# Patient Record
Sex: Female | Born: 1990 | Race: Black or African American | Hispanic: No | Marital: Single | Smoking: Former smoker
Health system: Southern US, Community
[De-identification: ages and names within clinical notes are randomized; demographics above are authoritative.]

## PROBLEM LIST (undated history)

## (undated) ENCOUNTER — Inpatient Hospital Stay (HOSPITAL_COMMUNITY): Payer: Self-pay

## (undated) DIAGNOSIS — R87619 Unspecified abnormal cytological findings in specimens from cervix uteri: Secondary | ICD-10-CM

## (undated) DIAGNOSIS — IMO0002 Reserved for concepts with insufficient information to code with codable children: Secondary | ICD-10-CM

## (undated) DIAGNOSIS — Z8619 Personal history of other infectious and parasitic diseases: Secondary | ICD-10-CM

## (undated) HISTORY — PX: WISDOM TOOTH EXTRACTION: SHX21

## (undated) HISTORY — DX: Personal history of other infectious and parasitic diseases: Z86.19

---
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
## 2009-01-26 ENCOUNTER — Emergency Department (HOSPITAL_COMMUNITY): Admission: EM | Admit: 2009-01-26 | Discharge: 2009-01-26 | Payer: Self-pay | Admitting: Family Medicine

## 2009-05-21 ENCOUNTER — Emergency Department (HOSPITAL_COMMUNITY): Admission: EM | Admit: 2009-05-21 | Discharge: 2009-05-21 | Payer: Self-pay | Admitting: Emergency Medicine

## 2009-10-29 ENCOUNTER — Emergency Department (HOSPITAL_COMMUNITY): Admission: EM | Admit: 2009-10-29 | Discharge: 2009-10-29 | Payer: Self-pay | Admitting: Emergency Medicine

## 2009-10-29 ENCOUNTER — Emergency Department (HOSPITAL_COMMUNITY)
Admission: EM | Admit: 2009-10-29 | Discharge: 2009-10-29 | Payer: Self-pay | Source: Home / Self Care | Admitting: Family Medicine

## 2010-01-30 NOTE — L&D Delivery Note (Signed)
Delivery Note At 8:58 PM a viable female was delivered via Vaginal, Vacuum (Extractor) (Presentation: ; Occiput Anterior).  APGAR: 4, 6; weight .   Placenta status: Intact, Spontaneous.  Cord: 3 vessels with the following complications: None.  Cord pH: done  Anesthesia: Epidural  Episiotomy: midline Lacerations:  Suture Repair: 2.0 vicryl Est. Blood Loss (mL):   Mom to postpartum.  Baby to NICU.  Laura Santos A 01/28/2011, 9:25 PM

## 2010-04-14 LAB — COMPREHENSIVE METABOLIC PANEL
Albumin: 4 g/dL (ref 3.5–5.2)
Alkaline Phosphatase: 55 U/L (ref 39–117)
CO2: 29 mEq/L (ref 19–32)
Chloride: 106 mEq/L (ref 96–112)
Creatinine, Ser: 0.76 mg/dL (ref 0.4–1.2)
Potassium: 3.9 mEq/L (ref 3.5–5.1)
Sodium: 138 mEq/L (ref 135–145)
Total Protein: 7.5 g/dL (ref 6.0–8.3)

## 2010-04-14 LAB — GC/CHLAMYDIA PROBE AMP, GENITAL
Chlamydia, DNA Probe: POSITIVE — AB
GC Probe Amp, Genital: POSITIVE — AB

## 2010-04-14 LAB — WET PREP, GENITAL: Yeast Wet Prep HPF POC: NONE SEEN

## 2010-04-14 LAB — POCT URINALYSIS DIPSTICK
Bilirubin Urine: NEGATIVE
Ketones, ur: NEGATIVE mg/dL
Nitrite: NEGATIVE
Specific Gravity, Urine: 1.015 (ref 1.005–1.030)
Urobilinogen, UA: 0.2 mg/dL (ref 0.0–1.0)
pH: 7.5 (ref 5.0–8.0)

## 2010-04-14 LAB — DIFFERENTIAL
Basophils Absolute: 0 10*3/uL (ref 0.0–0.1)
Eosinophils Absolute: 0 10*3/uL (ref 0.0–0.7)
Neutrophils Relative %: 82 % — ABNORMAL HIGH (ref 43–77)

## 2010-04-14 LAB — LIPASE, BLOOD: Lipase: 21 U/L (ref 11–59)

## 2010-04-14 LAB — CBC
Hemoglobin: 12.2 g/dL (ref 12.0–15.0)
MCV: 86.2 fL (ref 78.0–100.0)

## 2010-04-14 LAB — POCT PREGNANCY, URINE: Preg Test, Ur: NEGATIVE

## 2010-04-19 LAB — POCT PREGNANCY, URINE: Preg Test, Ur: NEGATIVE

## 2010-04-19 LAB — GC/CHLAMYDIA PROBE AMP, GENITAL
Chlamydia, DNA Probe: NEGATIVE
GC Probe Amp, Genital: NEGATIVE

## 2010-04-19 LAB — URINALYSIS, ROUTINE W REFLEX MICROSCOPIC
Nitrite: NEGATIVE
Specific Gravity, Urine: 1.023 (ref 1.005–1.030)
Urobilinogen, UA: 1 mg/dL (ref 0.0–1.0)
pH: 7 (ref 5.0–8.0)

## 2010-04-19 LAB — WET PREP, GENITAL

## 2010-05-02 LAB — POCT URINALYSIS DIP (DEVICE)
Hgb urine dipstick: NEGATIVE
Protein, ur: 100 mg/dL — AB
Specific Gravity, Urine: 1.015 (ref 1.005–1.030)
Urobilinogen, UA: 0.2 mg/dL (ref 0.0–1.0)

## 2010-05-02 LAB — GC/CHLAMYDIA PROBE AMP, GENITAL
Chlamydia, DNA Probe: NEGATIVE
GC Probe Amp, Genital: POSITIVE — AB

## 2010-05-02 LAB — POCT PREGNANCY, URINE: Preg Test, Ur: NEGATIVE

## 2010-05-02 LAB — WET PREP, GENITAL

## 2010-05-05 LAB — URINALYSIS, ROUTINE W REFLEX MICROSCOPIC
Bilirubin Urine: NEGATIVE
Hgb urine dipstick: NEGATIVE
Nitrite: NEGATIVE
Protein, ur: NEGATIVE mg/dL
Urobilinogen, UA: 1 mg/dL (ref 0.0–1.0)
pH: 7.5 (ref 5.0–8.0)

## 2010-05-05 LAB — WET PREP, GENITAL

## 2010-05-05 LAB — URINE MICROSCOPIC-ADD ON

## 2010-05-05 LAB — RPR: RPR Ser Ql: NONREACTIVE

## 2010-05-05 LAB — GC/CHLAMYDIA PROBE AMP, GENITAL: Chlamydia, DNA Probe: NEGATIVE

## 2010-05-24 ENCOUNTER — Inpatient Hospital Stay (INDEPENDENT_AMBULATORY_CARE_PROVIDER_SITE_OTHER)
Admission: RE | Admit: 2010-05-24 | Discharge: 2010-05-24 | Disposition: A | Discharge status: Home / Self Care | Payer: Self-pay | Source: Ambulatory Visit | Attending: Emergency Medicine | Admitting: Emergency Medicine

## 2010-05-24 DIAGNOSIS — B078 Other viral warts: Secondary | ICD-10-CM

## 2010-05-24 DIAGNOSIS — Z331 Pregnant state, incidental: Secondary | ICD-10-CM

## 2010-05-24 LAB — POCT PREGNANCY, URINE: Preg Test, Ur: POSITIVE

## 2010-05-24 LAB — WET PREP, GENITAL

## 2010-05-25 ENCOUNTER — Inpatient Hospital Stay (HOSPITAL_COMMUNITY)
Admission: RE | Admit: 2010-05-25 | Discharge: 2010-05-25 | Disposition: A | Discharge status: Home / Self Care | Payer: Medicaid Other | Source: Ambulatory Visit | Attending: Obstetrics & Gynecology | Admitting: Obstetrics & Gynecology

## 2010-05-25 DIAGNOSIS — A630 Anogenital (venereal) warts: Secondary | ICD-10-CM | POA: Insufficient documentation

## 2010-05-25 DIAGNOSIS — A5901 Trichomonal vulvovaginitis: Secondary | ICD-10-CM | POA: Insufficient documentation

## 2010-05-25 DIAGNOSIS — O98819 Other maternal infectious and parasitic diseases complicating pregnancy, unspecified trimester: Secondary | ICD-10-CM

## 2010-06-03 ENCOUNTER — Inpatient Hospital Stay (HOSPITAL_COMMUNITY)
Admission: AD | Admit: 2010-06-03 | Discharge: 2010-06-03 | Disposition: A | Discharge status: Home / Self Care | Payer: Medicaid Other | Source: Ambulatory Visit | Attending: Obstetrics & Gynecology | Admitting: Obstetrics & Gynecology

## 2010-06-03 DIAGNOSIS — O99891 Other specified diseases and conditions complicating pregnancy: Secondary | ICD-10-CM | POA: Insufficient documentation

## 2010-06-03 DIAGNOSIS — J069 Acute upper respiratory infection, unspecified: Secondary | ICD-10-CM | POA: Insufficient documentation

## 2010-06-06 ENCOUNTER — Other Ambulatory Visit: Payer: Self-pay | Admitting: Obstetrics & Gynecology

## 2010-06-06 DIAGNOSIS — O26849 Uterine size-date discrepancy, unspecified trimester: Secondary | ICD-10-CM

## 2010-06-08 ENCOUNTER — Ambulatory Visit (HOSPITAL_COMMUNITY): Admission: RE | Admit: 2010-06-08 | Payer: Medicaid Other | Source: Ambulatory Visit

## 2010-06-08 ENCOUNTER — Inpatient Hospital Stay (HOSPITAL_COMMUNITY)
Admission: AD | Admit: 2010-06-08 | Discharge: 2010-06-08 | Disposition: A | Discharge status: Home / Self Care | Payer: Medicaid Other | Source: Ambulatory Visit | Attending: Obstetrics and Gynecology | Admitting: Obstetrics and Gynecology

## 2010-06-08 ENCOUNTER — Ambulatory Visit (HOSPITAL_COMMUNITY)
Admission: RE | Admit: 2010-06-08 | Discharge: 2010-06-08 | Disposition: A | Discharge status: Home / Self Care | Payer: Medicaid Other | Source: Ambulatory Visit | Attending: Obstetrics & Gynecology | Admitting: Obstetrics & Gynecology

## 2010-06-08 DIAGNOSIS — O9989 Other specified diseases and conditions complicating pregnancy, childbirth and the puerperium: Secondary | ICD-10-CM

## 2010-06-08 DIAGNOSIS — O26849 Uterine size-date discrepancy, unspecified trimester: Secondary | ICD-10-CM

## 2010-06-08 DIAGNOSIS — O99891 Other specified diseases and conditions complicating pregnancy: Secondary | ICD-10-CM | POA: Insufficient documentation

## 2010-06-08 DIAGNOSIS — Z3689 Encounter for other specified antenatal screening: Secondary | ICD-10-CM | POA: Insufficient documentation

## 2010-06-16 ENCOUNTER — Inpatient Hospital Stay (HOSPITAL_COMMUNITY)
Admission: RE | Admit: 2010-06-16 | Discharge: 2010-06-16 | Disposition: A | Discharge status: Home / Self Care | Payer: Medicaid Other | Source: Ambulatory Visit | Attending: Emergency Medicine | Admitting: Emergency Medicine
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
## 2010-06-17 NOTE — H&P (Signed)
NAME:  Laura Santos, Laura Santos NO.:  [id number]   MEDICAL RECORD NO.:  [id number]          PATIENT TYPE:  INP   LOCATION:  0104                          FACILITY:  [facility]   PHYSICIAN:  Conni Slipper, MDDATE OF BIRTH:  [birth year]   DATE OF ADMISSION:  11/25/2005  DATE OF DISCHARGE:                         PSYCHIATRIC ADMISSION ASSESSMENT   IDENTIFYING INFORMATION:  The patient is a 20 year old young African-  American female who has dropped out of 10th grade, living with her  biological mother and 21 year old brother.   CHIEF COMPLAINT:  Depression, suicidal at times with overdose of multiple  medications.   HISTORY OF PRESENT ILLNESS:  The patient has been admitted to the [facility]  Emergency Department on an involuntary basis.  The patient was brought in by  the patient's biological mother with chief complaint of suicidal attempt  with multiple medications.  The patient reported that she has been feeling  depressed, lack of motivation, lack of energy, lack of interest, isolated,  withdrawn, does not feel like life is worth living.  She decided to overdose  on the medications when mother went out and brother was not at home with  intention of ending her life.  She took several medications of Vicodin,  multivitamins once a day, pills with calcium, doxycycline and some unknown  medications.  The patient stated that, after she took the medications, she  started feeling dizzy and got scared and came out and asked neighbors to  call for help.  The patient stated that she has several stressors including  dropped out of school, has multiple behavioral problems at school,  especially related to her attitude, talking back to the teachers and not  following directions, cursing at the teachers, regretted dropping out of  school in May of 2007.  She had tried to find a job.  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  She  could not find another job.  She also moved from the mother to the  grandmother briefly for a period of 4-5 months and came back for unknown  reasons.  The patient denied symptoms of mania or psychosis.  The patient  stated that it is a stress not being in school.  She has not received any  outpatient psychiatric care.   PAST PSYCHIATRIC HISTORY:  Not significant.   PAST MEDICAL HISTORY:  She has been overweight.  Otherwise physically  healthy.  No reported chronic medical conditions.  Denied head injuries,  seizures and surgeries.  She has not reported unprotected sexual activity.   ALLERGIES:  She has no known drug allergies.   MEDICATIONS:  She does not have any current medications.   SUBSTANCE ABUSE HISTORY:  The patient has been using drugs since she was 20  years old.  She smokes cigarettes, currently smoking three or four packs a  day.  She has been smoking alcohol twice a week and she used alcohol  occasionally.  She denies using cocaine and prescription pills.   FAMILY HISTORY:  Her mother and grandmother were having depression and  anxiety problems.  Her brother was diagnosed with attention-deficit  hyperactivity disorder and bipolar disorder.  Family relocated from  [place], [place] to [place] about four years ago.   SOCIAL HISTORY:  She has limited socialization or social activities or  friends.   MENTAL STATUS EXAM:  The patient appeared as a 20 year old young African-  American female.  Overweight, tall, no abnormal dysmorphia, no involuntary  movements.  Casually dressed.  Fully groomed.  Has significant psychomotor  retardation.  The patient stated mood is tired.  Her affect is depressed  without any animation or smiling.  Speech is normal rate and volume but  normal rhythm.  Her thought processes is linear and goal directed.  Thought  content is I want to end my life.  I don't see the reason to live anymore.  Nobody cares for me.  No job.  I'm not in  school.  She denies auditory or  visual hallucinations.  Her cognition seems to be impaired based on not able  to spell the world backwards.  Missed 2/3 word repetition.  She has full  insight, judgment and impulse control.  The patient stated that she wanted  to go back to school to become a professional.   DIAGNOSES:  AXIS I:  Major depressive disorder, single episode with a  suicidal attempt with overdosing on several different medications.  Nicotine  dependence.  Marijuana abuse.  Alcohol abuse.  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  AXIS II:  Deferred.  AXIS III:  Overweight.  AXIS IV:  Moderate to severe (psychosocial stressors, poor academics, school-  related problems, poor social skills, financial problems, environmental  problems, phase of life stressors).  AXIS V:   ESTIMATED LENGTH OF STAY:  Five to seven days.   INITIAL DISCHARGE PLAN:  Going to home.   INITIAL PLAN OF CARE:  The patient will be receiving safe and secure  therapeutic milieu.  She needs to be watched one-to-one for 24 hours, later  probably every 15 minutes for suicidal behaviors and gestures.  The patient  receives multimodal, multisystemic therapies like individual therapy, group  therapy, family therapy and substance abuse counseling and psychoeducation  during this evaluation.  She probably will benefit from the medication  therapy but required to get informed consent after discussing risks and  benefits of the medications with the patient's mother.           ______________________________  Conni Slipper, MD     JRJ/MEDQ  D:  11/26/2005  T:  11/27/2005  Job:  [id number]

## 2010-07-20 LAB — RPR: RPR: NONREACTIVE

## 2010-07-20 LAB — HEPATITIS B SURFACE ANTIGEN: Hepatitis B Surface Ag: NEGATIVE

## 2010-08-19 ENCOUNTER — Emergency Department (HOSPITAL_COMMUNITY)
Admission: EM | Admit: 2010-08-19 | Discharge: 2010-08-19 | Disposition: A | Discharge status: Home / Self Care | Payer: Medicaid Other | Attending: Emergency Medicine | Admitting: Emergency Medicine

## 2010-08-19 DIAGNOSIS — O99891 Other specified diseases and conditions complicating pregnancy: Secondary | ICD-10-CM | POA: Insufficient documentation

## 2010-08-19 DIAGNOSIS — S3981XA Other specified injuries of abdomen, initial encounter: Secondary | ICD-10-CM | POA: Insufficient documentation

## 2010-08-19 DIAGNOSIS — W108XXA Fall (on) (from) other stairs and steps, initial encounter: Secondary | ICD-10-CM | POA: Insufficient documentation

## 2010-08-19 DIAGNOSIS — R109 Unspecified abdominal pain: Secondary | ICD-10-CM | POA: Insufficient documentation

## 2010-09-21 ENCOUNTER — Emergency Department (HOSPITAL_COMMUNITY)
Admission: EM | Admit: 2010-09-21 | Discharge: 2010-09-21 | Disposition: A | Discharge status: Home / Self Care | Payer: Medicaid Other | Attending: Emergency Medicine | Admitting: Emergency Medicine

## 2010-09-21 DIAGNOSIS — Y9289 Other specified places as the place of occurrence of the external cause: Secondary | ICD-10-CM | POA: Insufficient documentation

## 2010-09-21 DIAGNOSIS — O9989 Other specified diseases and conditions complicating pregnancy, childbirth and the puerperium: Secondary | ICD-10-CM | POA: Insufficient documentation

## 2010-09-21 DIAGNOSIS — R109 Unspecified abdominal pain: Secondary | ICD-10-CM | POA: Insufficient documentation

## 2010-09-21 LAB — URINALYSIS, ROUTINE W REFLEX MICROSCOPIC
Bilirubin Urine: NEGATIVE
Glucose, UA: NEGATIVE mg/dL
Ketones, ur: NEGATIVE mg/dL
Leukocytes, UA: NEGATIVE
Protein, ur: NEGATIVE mg/dL

## 2011-01-16 ENCOUNTER — Encounter (HOSPITAL_COMMUNITY): Payer: Self-pay | Admitting: *Deleted

## 2011-01-16 ENCOUNTER — Inpatient Hospital Stay (HOSPITAL_COMMUNITY)
Admission: AD | Admit: 2011-01-16 | Discharge: 2011-01-16 | Disposition: A | Discharge status: Home / Self Care | Payer: Medicaid Other | Source: Ambulatory Visit | Attending: Obstetrics | Admitting: Obstetrics

## 2011-01-16 DIAGNOSIS — Z3480 Encounter for supervision of other normal pregnancy, unspecified trimester: Secondary | ICD-10-CM

## 2011-01-16 DIAGNOSIS — A499 Bacterial infection, unspecified: Secondary | ICD-10-CM

## 2011-01-16 DIAGNOSIS — Z331 Pregnant state, incidental: Secondary | ICD-10-CM

## 2011-01-16 DIAGNOSIS — B9689 Other specified bacterial agents as the cause of diseases classified elsewhere: Secondary | ICD-10-CM

## 2011-01-16 DIAGNOSIS — N760 Acute vaginitis: Secondary | ICD-10-CM | POA: Insufficient documentation

## 2011-01-16 DIAGNOSIS — O2390 Unspecified genitourinary tract infection in pregnancy, unspecified trimester: Secondary | ICD-10-CM | POA: Insufficient documentation

## 2011-01-16 HISTORY — DX: Unspecified abnormal cytological findings in specimens from cervix uteri: R87.619

## 2011-01-16 HISTORY — DX: Reserved for concepts with insufficient information to code with codable children: IMO0002

## 2011-01-16 LAB — WET PREP, GENITAL

## 2011-01-16 MED ORDER — TINIDAZOLE 500 MG PO TABS: 2.0000 g | ORAL_TABLET | Freq: Every day | ORAL | Status: AC

## 2011-01-16 NOTE — Progress Notes (Signed)
Pt c/o white, thick, discharge for the past 4 weeks, pt came in today b/c she is close to her delivery date and she was concerned.

## 2011-01-16 NOTE — ED Provider Notes (Signed)
Chief Complaint:  Vaginal Discharge  Laura Santos is  20 y.o. G1P0.  No LMP recorded. Patient is pregnant.. Unknown Onset is described as unclear and has been present for  Several  weeks. Has not seen Dr. Gaynell Face since November due to transportation issues. Reports good FM, irreg ctx, denies vag blding, LOF, dysuria.  Obstetrical/Gynecological History: OB History    Grav Para Term Preterm Abortions TAB SAB Ect Mult Living   1               Past Medical History: Past Medical History  Diagnosis Date  . HPV-genital warts   . Abnormal Pap smear     Past Surgical History: Past Surgical History  Procedure Date  . Wisdom tooth extraction     Family History: Family History  Problem Relation Age of Onset  . Hypertension Mother   . Diabetes Maternal Aunt   . Hypertension Maternal Aunt   . Diabetes Maternal Grandmother   . Hypertension Maternal Grandmother   . Diabetes Cousin     Social History: History  Substance Use Topics  . Smoking status: Former Smoker -- 0.5 packs/day for 4 years    Types: Cigarettes    Quit date: 08/16/2010  . Smokeless tobacco: Not on file  . Alcohol Use: No    Allergies: No Known Allergies  No prescriptions prior to admission    Review of Systems - Negative except what has been reviewed in the HPI  Physical Exam   There were no vitals taken for this visit.  General: General appearance - alert, well appearing, and in no distress and oriented to person, place, and time Mental status - alert, oriented to person, place, and time, normal mood, behavior, speech, dress, motor activity, and thought processes, affect appropriate to mood Abdomen - gravid, nontender Focused Gynecological Exam: VULVA: vulvar lesion multiple condyloma, VAGINA: vaginal discharge - white and copious, CERVIX: closed/long/vtx  Labs: Recent Results (from the past 24 hour(s))  WET PREP, GENITAL   Collection Time   01/16/11  9:15 PM      Component Value Range   Yeast,  Wet Prep NONE SEEN  NONE SEEN    Trich, Wet Prep NONE SEEN  NONE SEEN    Clue Cells, Wet Prep FEW (*) NONE SEEN    WBC, Wet Prep HPF POC FEW (*) NONE SEEN    Imaging Studies:  No results found.   Assessment: BV  Plan: Discharge home Rx tindamax Strongly encourage to FU with Dr. Gaynell Face this week GBS sent  [facility] E. 01/16/2011,9:53 PM

## 2011-01-19 LAB — CULTURE, BETA STREP (GROUP B ONLY)

## 2011-01-25 ENCOUNTER — Encounter (HOSPITAL_COMMUNITY): Payer: Self-pay | Admitting: *Deleted

## 2011-01-25 ENCOUNTER — Telehealth (HOSPITAL_COMMUNITY): Payer: Self-pay | Admitting: *Deleted

## 2011-01-25 NOTE — Telephone Encounter (Signed)
Preadmission screen  

## 2011-01-27 ENCOUNTER — Encounter (HOSPITAL_COMMUNITY): Payer: Self-pay | Admitting: *Deleted

## 2011-01-27 ENCOUNTER — Encounter (HOSPITAL_COMMUNITY): Payer: Self-pay

## 2011-01-27 ENCOUNTER — Telehealth (HOSPITAL_COMMUNITY): Payer: Self-pay | Admitting: *Deleted

## 2011-01-27 ENCOUNTER — Inpatient Hospital Stay (HOSPITAL_COMMUNITY)
Admission: RE | Admit: 2011-01-27 | Discharge: 2011-01-30 | DRG: 774 | Disposition: A | Discharge status: Home / Self Care | Payer: Medicaid Other | Source: Ambulatory Visit | Attending: Obstetrics | Admitting: Obstetrics

## 2011-01-27 LAB — RAPID HIV SCREEN (WH-MAU): Rapid HIV Screen: NONREACTIVE

## 2011-01-27 LAB — CBC
Hemoglobin: 9.8 g/dL — ABNORMAL LOW (ref 12.0–15.0)
MCH: 24.9 pg — ABNORMAL LOW (ref 26.0–34.0)
RBC: 3.94 MIL/uL (ref 3.87–5.11)

## 2011-01-27 MED ORDER — ONDANSETRON HCL 4 MG/2ML IJ SOLN: 4.0000 mg | Freq: Four times a day (QID) | INTRAMUSCULAR | Status: DC | PRN

## 2011-01-27 MED ORDER — FLEET ENEMA 7-19 GM/118ML RE ENEM: 1.0000 | ENEMA | RECTAL | Status: DC | PRN

## 2011-01-27 MED ORDER — OXYCODONE-ACETAMINOPHEN 5-325 MG PO TABS
2.0000 | ORAL_TABLET | ORAL | Status: DC | PRN
  Administered 2011-01-29 (×2): 1 via ORAL
  Filled 2011-01-27 (×2): qty 1

## 2011-01-27 MED ORDER — LACTATED RINGERS IV SOLN
INTRAVENOUS | Status: DC
  Administered 2011-01-27: 125 mL/h via INTRAVENOUS
  Administered 2011-01-28 (×3): via INTRAVENOUS

## 2011-01-27 MED ORDER — PROMETHAZINE HCL 25 MG/ML IJ SOLN
12.5000 mg | Freq: Four times a day (QID) | INTRAMUSCULAR | Status: DC | PRN
  Administered 2011-01-28: 12.5 mg via INTRAVENOUS
  Filled 2011-01-27: qty 1

## 2011-01-27 MED ORDER — BUTORPHANOL TARTRATE 2 MG/ML IJ SOLN
1.0000 mg | INTRAMUSCULAR | Status: DC | PRN
  Administered 2011-01-28 (×2): 1 mg via INTRAVENOUS
  Filled 2011-01-27 (×2): qty 1

## 2011-01-27 MED ORDER — OXYTOCIN 20 UNITS IN LACTATED RINGERS INFUSION - SIMPLE
1.0000 m[IU]/min | INTRAVENOUS | Status: DC
  Administered 2011-01-27: 1 m[IU]/min via INTRAVENOUS
  Administered 2011-01-28: 6 m[IU]/min via INTRAVENOUS
  Filled 2011-01-27: qty 1000

## 2011-01-27 MED ORDER — TERBUTALINE SULFATE 1 MG/ML IJ SOLN: 0.2500 mg | Freq: Once | INTRAMUSCULAR | Status: AC | PRN

## 2011-01-27 MED ORDER — IBUPROFEN 600 MG PO TABS: 600.0000 mg | ORAL_TABLET | Freq: Four times a day (QID) | ORAL | Status: DC | PRN

## 2011-01-27 MED ORDER — OXYTOCIN BOLUS FROM INFUSION
500.0000 mL | Freq: Once | INTRAVENOUS | Status: DC
  Filled 2011-01-27: qty 1000
  Filled 2011-01-27: qty 500

## 2011-01-27 MED ORDER — ACETAMINOPHEN 325 MG PO TABS: 650.0000 mg | ORAL_TABLET | ORAL | Status: DC | PRN

## 2011-01-27 MED ORDER — LIDOCAINE HCL (PF) 1 % IJ SOLN
30.0000 mL | INTRAMUSCULAR | Status: DC | PRN
  Administered 2011-01-28: 30 mL via SUBCUTANEOUS
  Filled 2011-01-27: qty 30

## 2011-01-27 MED ORDER — OXYTOCIN 20 UNITS IN LACTATED RINGERS INFUSION - SIMPLE
125.0000 mL/h | Freq: Once | INTRAVENOUS | Status: AC
  Administered 2011-01-28: 999 mL/h via INTRAVENOUS

## 2011-01-27 MED ORDER — LACTATED RINGERS IV SOLN
500.0000 mL | INTRAVENOUS | Status: DC | PRN
  Administered 2011-01-28 (×2): 1000 mL via INTRAVENOUS

## 2011-01-27 MED ORDER — CITRIC ACID-SODIUM CITRATE 334-500 MG/5ML PO SOLN: 30.0000 mL | ORAL | Status: DC | PRN

## 2011-01-27 NOTE — Telephone Encounter (Signed)
Preadmission screen  

## 2011-01-27 NOTE — Progress Notes (Signed)
Dr. Gaynell Face notified of pt status, FHR, UC pattern, and SVE. Orders received. Will continue to monitor.

## 2011-01-28 ENCOUNTER — Other Ambulatory Visit: Payer: Self-pay | Admitting: Obstetrics

## 2011-01-28 ENCOUNTER — Inpatient Hospital Stay (HOSPITAL_COMMUNITY): Payer: Medicaid Other | Admitting: Anesthesiology

## 2011-01-28 ENCOUNTER — Encounter (HOSPITAL_COMMUNITY): Payer: Self-pay | Admitting: Anesthesiology

## 2011-01-28 ENCOUNTER — Encounter (HOSPITAL_COMMUNITY): Payer: Self-pay

## 2011-01-28 LAB — RPR: RPR Ser Ql: NONREACTIVE

## 2011-01-28 MED ORDER — LACTATED RINGERS IV SOLN: 500.0000 mL | Freq: Once | INTRAVENOUS | Status: DC

## 2011-01-28 MED ORDER — SIMETHICONE 80 MG PO CHEW: 80.0000 mg | CHEWABLE_TABLET | ORAL | Status: DC | PRN

## 2011-01-28 MED ORDER — ONDANSETRON HCL 4 MG/2ML IJ SOLN: 4.0000 mg | INTRAMUSCULAR | Status: DC | PRN

## 2011-01-28 MED ORDER — SENNOSIDES-DOCUSATE SODIUM 8.6-50 MG PO TABS
2.0000 | ORAL_TABLET | Freq: Every day | ORAL | Status: DC
  Administered 2011-01-29: 2 via ORAL

## 2011-01-28 MED ORDER — IBUPROFEN 600 MG PO TABS
600.0000 mg | ORAL_TABLET | Freq: Four times a day (QID) | ORAL | Status: DC
Start: 2011-01-29 — End: 2011-01-30
  Administered 2011-01-28 – 2011-01-30 (×6): 600 mg via ORAL
  Filled 2011-01-28 (×7): qty 1

## 2011-01-28 MED ORDER — FERROUS SULFATE 325 (65 FE) MG PO TABS
325.0000 mg | ORAL_TABLET | Freq: Two times a day (BID) | ORAL | Status: DC
  Administered 2011-01-29 – 2011-01-30 (×3): 325 mg via ORAL
  Filled 2011-01-28 (×4): qty 1

## 2011-01-28 MED ORDER — FENTANYL 2.5 MCG/ML BUPIVACAINE 1/10 % EPIDURAL INFUSION (WH - ANES)
INTRAMUSCULAR | Status: DC | PRN
  Administered 2011-01-28: 17:00:00
  Administered 2011-01-28: 14 mL/h via EPIDURAL

## 2011-01-28 MED ORDER — EPHEDRINE 5 MG/ML INJ
10.0000 mg | INTRAVENOUS | Status: DC | PRN
  Filled 2011-01-28: qty 4

## 2011-01-28 MED ORDER — LANOLIN HYDROUS EX OINT: TOPICAL_OINTMENT | CUTANEOUS | Status: DC | PRN

## 2011-01-28 MED ORDER — SODIUM CHLORIDE 0.9 % IV SOLN
2.0000 g | Freq: Once | INTRAVENOUS | Status: AC
  Administered 2011-01-28: 2 g via INTRAVENOUS
  Filled 2011-01-28: qty 2000

## 2011-01-28 MED ORDER — WITCH HAZEL-GLYCERIN EX PADS: 1.0000 "application " | MEDICATED_PAD | CUTANEOUS | Status: DC | PRN

## 2011-01-28 MED ORDER — OXYCODONE-ACETAMINOPHEN 5-325 MG PO TABS
1.0000 | ORAL_TABLET | ORAL | Status: DC | PRN
  Administered 2011-01-29 – 2011-01-30 (×4): 1 via ORAL
  Filled 2011-01-28 (×4): qty 1

## 2011-01-28 MED ORDER — DIBUCAINE 1 % RE OINT
1.0000 "application " | TOPICAL_OINTMENT | RECTAL | Status: DC | PRN
  Administered 2011-01-30: 1 via RECTAL
  Filled 2011-01-28 (×2): qty 28

## 2011-01-28 MED ORDER — ONDANSETRON HCL 4 MG PO TABS: 4.0000 mg | ORAL_TABLET | ORAL | Status: DC | PRN

## 2011-01-28 MED ORDER — DIPHENHYDRAMINE HCL 25 MG PO CAPS: 25.0000 mg | ORAL_CAPSULE | Freq: Four times a day (QID) | ORAL | Status: DC | PRN

## 2011-01-28 MED ORDER — BENZOCAINE-MENTHOL 20-0.5 % EX AERO
1.0000 "application " | INHALATION_SPRAY | CUTANEOUS | Status: DC | PRN
  Filled 2011-01-28: qty 56

## 2011-01-28 MED ORDER — DIPHENHYDRAMINE HCL 50 MG/ML IJ SOLN
12.5000 mg | INTRAMUSCULAR | Status: DC | PRN
  Administered 2011-01-28: 12.5 mg via INTRAVENOUS
  Filled 2011-01-28: qty 1

## 2011-01-28 MED ORDER — EPHEDRINE 5 MG/ML INJ: 10.0000 mg | INTRAVENOUS | Status: DC | PRN

## 2011-01-28 MED ORDER — PHENYLEPHRINE 40 MCG/ML (10ML) SYRINGE FOR IV PUSH (FOR BLOOD PRESSURE SUPPORT): 80.0000 ug | PREFILLED_SYRINGE | INTRAVENOUS | Status: DC | PRN

## 2011-01-28 MED ORDER — SODIUM BICARBONATE 8.4 % IV SOLN
INTRAVENOUS | Status: DC | PRN
  Administered 2011-01-28: 5 mL via EPIDURAL

## 2011-01-28 MED ORDER — ZOLPIDEM TARTRATE 5 MG PO TABS: 5.0000 mg | ORAL_TABLET | Freq: Every evening | ORAL | Status: DC | PRN

## 2011-01-28 MED ORDER — PHENYLEPHRINE 40 MCG/ML (10ML) SYRINGE FOR IV PUSH (FOR BLOOD PRESSURE SUPPORT)
80.0000 ug | PREFILLED_SYRINGE | INTRAVENOUS | Status: DC | PRN
  Filled 2011-01-28: qty 5

## 2011-01-28 MED ORDER — TETANUS-DIPHTH-ACELL PERTUSSIS 5-2.5-18.5 LF-MCG/0.5 IM SUSP
0.5000 mL | Freq: Once | INTRAMUSCULAR | Status: AC
  Administered 2011-01-29: 0.5 mL via INTRAMUSCULAR
  Filled 2011-01-28 (×2): qty 0.5

## 2011-01-28 MED ORDER — FENTANYL 2.5 MCG/ML BUPIVACAINE 1/10 % EPIDURAL INFUSION (WH - ANES)
14.0000 mL/h | INTRAMUSCULAR | Status: DC
  Administered 2011-01-28 (×3): 14 mL/h via EPIDURAL
  Filled 2011-01-28 (×5): qty 60

## 2011-01-28 MED ORDER — PRENATAL MULTIVITAMIN CH
1.0000 | ORAL_TABLET | Freq: Every day | ORAL | Status: DC
  Administered 2011-01-29 – 2011-01-30 (×2): 1 via ORAL
  Filled 2011-01-28 (×2): qty 1

## 2011-01-28 NOTE — Anesthesia Procedure Notes (Signed)

## 2011-01-28 NOTE — Progress Notes (Signed)
Patient ID: Laura Santos, female   DOB: Jun 29, 1990, 20 y.o.   MRN: [id number] And and and and and and p.m. And patient dilated and him pushing started having some leg D cells shows a started on O2 8:30 PM temp is 100.9 and then she was given ampicillin 2 g IV and legs the persistent vertex without plus station 8:58 PM vacuum applied over a midline episiotomy and and normal vaginal delivery Apgar 4782 minutes 10 minutes half to 1 minute is a local called which was clipped prior to delivered cord pH done placenta delivered spontaneously intact and episiotomy repaired with 2-0 Vicryl

## 2011-01-28 NOTE — Progress Notes (Addendum)
Dr. Gaynell Face notified of pt status, FHR and increase in baseline,  decelerations with pushing and nursing intervetions. Will continue to monitor.

## 2011-01-28 NOTE — Progress Notes (Signed)
Encouraged pt to change position, pt states not wanting to,,,,, pt teaching to help with disfunctional labor pattern and possible pain relief

## 2011-01-28 NOTE — H&P (Signed)
ndthis is Dr. Francoise Ceo dictating the history and physical on Laura Santos she is a 20 year old primigravida at 4 weeks her due dates 01/20/2011 she is a negative GBS and she was admitted for induction her cervix was 1 cm 80% vertex at -2-3 station amniotomy was performed the fluids clear she is on low-dose Pitocin and having irregular contractions Past medical history negative Past surgical history negative Social history negative Family history negative System review she's been treated for that her and Chlamydia Physical exam revealed a well-developed female in no distress HEENT irregular  Heart regular rhythm no murmurs no gallops Breasts negative Abdomen term Pelvic as described above Extremities negative

## 2011-01-28 NOTE — Progress Notes (Signed)
Dr. Gaynell Face notified of pt status, FHR and increase in baseline, declarations, nursing interventions, SVE and maternal temperature. Orders received. Will continue to monitor.

## 2011-01-28 NOTE — Progress Notes (Signed)
Vacuum assisted delivery of viable female.

## 2011-01-28 NOTE — Anesthesia Preprocedure Evaluation (Signed)

## 2011-01-29 LAB — CBC
Hemoglobin: 7.9 g/dL — ABNORMAL LOW (ref 12.0–15.0)
MCH: 25.5 pg — ABNORMAL LOW (ref 26.0–34.0)
MCV: 80.3 fL (ref 78.0–100.0)
Platelets: 206 10*3/uL (ref 150–400)
RBC: 3.1 MIL/uL — ABNORMAL LOW (ref 3.87–5.11)
WBC: 27.1 10*3/uL — ABNORMAL HIGH (ref 4.0–10.5)

## 2011-01-29 MED ORDER — BENZOCAINE-MENTHOL 20-0.5 % EX AERO
INHALATION_SPRAY | CUTANEOUS | Status: AC
  Filled 2011-01-29: qty 56

## 2011-01-29 NOTE — Progress Notes (Signed)
Patient ID: Laura Santos, female   DOB: 1990/08/22, 20 y.o.   MRN: [id number] Postpartum day one Vital signs normal Fundus firm Lochia moderate Legs negative No complaints

## 2011-01-29 NOTE — Anesthesia Postprocedure Evaluation (Signed)
  Anesthesia Post-op Note  Patient: Laura Santos  Procedure(s) Performed: * No procedures listed *  Patient Location: Mother/Baby  Anesthesia Type: Epidural  Level of Consciousness: awake, alert  and oriented  Airway and Oxygen Therapy: Patient Spontanous Breathing  Post-op Pain: mild  Post-op Assessment: Patient's Cardiovascular Status Stable, Respiratory Function Stable, Patent Airway, No signs of Nausea or vomiting and Pain level controlled  Post-op Vital Signs: stable  Complications: No apparent anesthesia complications

## 2011-01-30 MED ORDER — OXYCODONE-ACETAMINOPHEN 5-325 MG PO TABS: 1.0000 | ORAL_TABLET | ORAL | Status: AC | PRN

## 2011-01-30 MED ORDER — INFLUENZA VIRUS VACC SPLIT PF IM SUSP
0.5000 mL | Freq: Once | INTRAMUSCULAR | Status: AC
  Administered 2011-01-30: 0.5 mL via INTRAMUSCULAR
  Filled 2011-01-30: qty 0.5

## 2011-01-30 NOTE — Discharge Summary (Signed)
Obstetric Discharge Summary Reason for Admission: onset of labor Prenatal Procedures: none Intrapartum Procedures: spontaneous vaginal delivery Postpartum Procedures: none Complications-Operative and Postpartum: none Hemoglobin  Date Value Range Status  01/29/2011 7.9* 12.0-15.0 (g/dL) Final     DELTA CHECK NOTED     REPEATED TO VERIFY     HCT  Date Value Range Status  01/29/2011 24.9* 36.0-46.0 (%) Final    Discharge Diagnoses: Term Pregnancy-delivered  Discharge Information: Date: 01/30/2011 Activity: pelvic rest Diet: routine Medications: Percocet Condition: stable Instructions: refer to practice specific booklet Discharge to: home Follow-up Information    Follow up with Saamiya Jeppsen A, MD. Call in 6 weeks.   Contact information:   [address] [place] [address] [phone]          Newborn Data: Live born female  Birth Weight: 7 lb 2.1 oz (3235 g) APGAR: 4, 6  Home with mother.  Lilymarie Scroggins A 01/30/2011, 6:27 AM

## 2011-01-30 NOTE — Progress Notes (Signed)
UR chart review completed.  

## 2011-04-22 ENCOUNTER — Encounter (HOSPITAL_COMMUNITY): Payer: Self-pay | Admitting: Obstetrics and Gynecology

## 2011-04-22 ENCOUNTER — Inpatient Hospital Stay (HOSPITAL_COMMUNITY)
Admission: AD | Admit: 2011-04-22 | Discharge: 2011-04-22 | Disposition: A | Discharge status: Home / Self Care | Payer: Self-pay | Source: Ambulatory Visit | Attending: Obstetrics & Gynecology | Admitting: Obstetrics & Gynecology

## 2011-04-22 DIAGNOSIS — N760 Acute vaginitis: Secondary | ICD-10-CM | POA: Insufficient documentation

## 2011-04-22 DIAGNOSIS — N949 Unspecified condition associated with female genital organs and menstrual cycle: Secondary | ICD-10-CM | POA: Insufficient documentation

## 2011-04-22 DIAGNOSIS — A499 Bacterial infection, unspecified: Secondary | ICD-10-CM | POA: Insufficient documentation

## 2011-04-22 DIAGNOSIS — B9689 Other specified bacterial agents as the cause of diseases classified elsewhere: Secondary | ICD-10-CM | POA: Insufficient documentation

## 2011-04-22 LAB — URINALYSIS, ROUTINE W REFLEX MICROSCOPIC
Bilirubin Urine: NEGATIVE
Glucose, UA: NEGATIVE mg/dL
Hgb urine dipstick: NEGATIVE
Ketones, ur: NEGATIVE mg/dL
Leukocytes, UA: NEGATIVE
Nitrite: NEGATIVE
Protein, ur: NEGATIVE mg/dL
Specific Gravity, Urine: 1.02 (ref 1.005–1.030)
Urobilinogen, UA: 0.2 mg/dL (ref 0.0–1.0)
pH: 6.5 (ref 5.0–8.0)

## 2011-04-22 LAB — POCT PREGNANCY, URINE: Preg Test, Ur: NEGATIVE

## 2011-04-22 MED ORDER — FLUCONAZOLE 150 MG PO TABS: 150.0000 mg | ORAL_TABLET | Freq: Once | ORAL | Status: AC

## 2011-04-22 MED ORDER — METRONIDAZOLE 0.75 % VA GEL: 1.0000 | Freq: Every day | VAGINAL | Status: AC

## 2011-04-22 NOTE — MAU Note (Signed)
Pt reports having a white thin watery discharge with a  "fishy" smell for 2-3 days.  Pt reprots having left abd pain as well.

## 2011-04-22 NOTE — Discharge Instructions (Signed)
Bacterial Vaginosis Bacterial vaginosis (BV) is a vaginal infection where the normal balance of bacteria in the vagina is disrupted. The normal balance is then replaced by an overgrowth of certain bacteria. There are several different kinds of bacteria that can cause BV. BV is the most common vaginal infection in women of childbearing age. CAUSES   The cause of BV is not fully understood. BV develops when there is an increase or imbalance of harmful bacteria.   Some activities or behaviors can upset the normal balance of bacteria in the vagina and put women at increased risk including:   Having a new sex partner or multiple sex partners.   Douching.   Using an intrauterine device (IUD) for contraception.   It is not clear what role sexual activity plays in the development of BV. However, women that have never had sexual intercourse are rarely infected with BV.  Women do not get BV from toilet seats, bedding, swimming pools or from touching objects around them.  SYMPTOMS   Grey vaginal discharge.   A fish-like odor with discharge, especially after sexual intercourse.   Itching or burning of the vagina and vulva.   Burning or pain with urination.   Some women have no signs or symptoms at all.  DIAGNOSIS  Your caregiver must examine the vagina for signs of BV. Your caregiver will perform lab tests and look at the sample of vaginal fluid through a microscope. They will look for bacteria and abnormal cells (clue cells), a pH test higher than 4.5, and a positive amine test all associated with BV.  RISKS AND COMPLICATIONS   Pelvic inflammatory disease (PID).   Infections following gynecology surgery.   Developing HIV.   Developing herpes virus.  TREATMENT  Sometimes BV will clear up without treatment. However, all women with symptoms of BV should be treated to avoid complications, especially if gynecology surgery is planned. Female partners generally do not need to be treated. However,  BV may spread between female sex partners so treatment is helpful in preventing a recurrence of BV.   BV may be treated with antibiotics. The antibiotics come in either pill or vaginal cream forms. Either can be used with nonpregnant or pregnant women, but the recommended dosages differ. These antibiotics are not harmful to the baby.   BV can recur after treatment. If this happens, a second round of antibiotics will often be prescribed.   Treatment is important for pregnant women. If not treated, BV can cause a premature delivery, especially for a pregnant woman who had a premature birth in the past. All pregnant women who have symptoms of BV should be checked and treated.   For chronic reoccurrence of BV, treatment with a type of prescribed gel vaginally twice a week is helpful.  HOME CARE INSTRUCTIONS   Finish all medication as directed by your caregiver.   Do not have sex until treatment is completed.   Tell your sexual partner that you have a vaginal infection. They should see their caregiver and be treated if they have problems, such as a mild rash or itching.   Practice safe sex. Use condoms. Only have 1 sex partner.  PREVENTION  Basic prevention steps can help reduce the risk of upsetting the natural balance of bacteria in the vagina and developing BV:  Do not have sexual intercourse (be abstinent).   Do not douche.   Use all of the medicine prescribed for treatment of BV, even if the signs and symptoms go away.     Tell your sex partner if you have BV. That way, they can be treated, if needed, to prevent reoccurrence.  SEEK MEDICAL CARE IF:   Your symptoms are not improving after 3 days of treatment.   You have increased discharge, pain, or fever.  MAKE SURE YOU:   Understand these instructions.   Will watch your condition.   Will get help right away if you are not doing well or get worse.  FOR MORE INFORMATION  Division of STD Prevention (DSTDP), Centers for Disease  Control and Prevention: www.cdc.gov/std American Social Health Association (ASHA): www.ashastd.org  Document Released: 01/16/2005 Document Revised: 01/05/2011 Document Reviewed: 07/09/2008 ExitCare Patient Information 2012 [employer]. 

## 2011-04-22 NOTE — ED Provider Notes (Signed)
First Provider Initiated Contact with Patient 04/22/11 1543      Chief Complaint:  Vaginal Discharge   Laura Santos is  21 y.o. G1P1001.  Patient's last menstrual period was 04/03/2011.Marland Kitchen  Her pregnancy status is negative.  She presents complaining of Vaginal Discharge  She states it has a fishy odor    Past Medical History  Diagnosis Date  . HPV-genital warts   . Abnormal Pap smear   . History of chlamydia   . History of gonorrhea     Past Surgical History  Procedure Date  . Wisdom tooth extraction     Family History  Problem Relation Age of Onset  . Hypertension Mother   . Diabetes Maternal Aunt   . Hypertension Maternal Aunt   . Diabetes Maternal Grandmother   . Hypertension Maternal Grandmother   . Diabetes Cousin     History  Substance Use Topics  . Smoking status: Former Smoker -- 0.5 packs/day for 4 years    Types: Cigarettes    Quit date: 08/16/2010  . Smokeless tobacco: Not on file  . Alcohol Use: No    Allergies: No Known Allergies  No prescriptions prior to admission    Review of Systems - Negative except fishy odor and vaginal discharge   Physical Exam   Blood pressure 106/66, pulse 104, temperature 99.1 F (37.3 C), temperature source Oral, resp. rate 18, height 5\' 7"  (1.702 m), weight 181 lb 6.4 oz (82.283 kg), last menstrual period 04/03/2011, unknown if currently breastfeeding.Pt is not breastfeeding General: General appearance - alert, well appearing, and in no distress Chest - no tachypnea, retractions or cyanosis Heart - normal rate and regular rhythm Abdomen - soft, nontender, nondistended, no masses or organomegaly Pelvic - normal external genitalia, vulva, vagina, cervix, uterus and adnexa; SSE:  Thin white discharge with amine odor  WET PREP:  + clue, negative for WBC, trich Extremities - peripheral pulses normal, no pedal edema, no clubbing or cyanosis   Labs: Recent Results (from the past 24 hour(s))  POCT PREGNANCY, URINE    Collection Time   04/22/11  3:30 PM      Component Value Range   Preg Test, Ur NEGATIVE  NEGATIVE    Imaging Studies:  No results found.   Assessment:  Bacterial vaginosis Plan: Treat with metronidazole  CRESENZO-DISHMAN,Yazen Rosko

## 2011-04-24 NOTE — ED Provider Notes (Signed)
Medical Screening exam and patient care preformed by advanced practice provider.  Agree with the above management.  

## 2011-07-23 ENCOUNTER — Encounter (HOSPITAL_COMMUNITY): Payer: Self-pay | Admitting: Emergency Medicine

## 2011-07-23 ENCOUNTER — Emergency Department (HOSPITAL_COMMUNITY)
Admission: EM | Admit: 2011-07-23 | Discharge: 2011-07-23 | Disposition: A | Discharge status: Home / Self Care | Payer: Self-pay | Attending: Emergency Medicine | Admitting: Emergency Medicine

## 2011-07-23 DIAGNOSIS — Z833 Family history of diabetes mellitus: Secondary | ICD-10-CM | POA: Insufficient documentation

## 2011-07-23 DIAGNOSIS — Z8249 Family history of ischemic heart disease and other diseases of the circulatory system: Secondary | ICD-10-CM | POA: Insufficient documentation

## 2011-07-23 DIAGNOSIS — B977 Papillomavirus as the cause of diseases classified elsewhere: Secondary | ICD-10-CM | POA: Insufficient documentation

## 2011-07-23 DIAGNOSIS — Z87891 Personal history of nicotine dependence: Secondary | ICD-10-CM | POA: Insufficient documentation

## 2011-07-23 DIAGNOSIS — N739 Female pelvic inflammatory disease, unspecified: Secondary | ICD-10-CM | POA: Insufficient documentation

## 2011-07-23 LAB — URINALYSIS, ROUTINE W REFLEX MICROSCOPIC
Hgb urine dipstick: NEGATIVE
Protein, ur: 30 mg/dL — AB
Urobilinogen, UA: 1 mg/dL (ref 0.0–1.0)

## 2011-07-23 LAB — WET PREP, GENITAL

## 2011-07-23 LAB — URINE MICROSCOPIC-ADD ON

## 2011-07-23 MED ORDER — DOXYCYCLINE HYCLATE 100 MG PO CAPS: 100.0000 mg | ORAL_CAPSULE | Freq: Two times a day (BID) | ORAL | Status: AC

## 2011-07-23 MED ORDER — CEFTRIAXONE SODIUM 250 MG IJ SOLR
250.0000 mg | Freq: Once | INTRAMUSCULAR | Status: AC
  Administered 2011-07-23: 250 mg via INTRAMUSCULAR
  Filled 2011-07-23: qty 250

## 2011-07-23 MED ORDER — METRONIDAZOLE 500 MG PO TABS
500.0000 mg | ORAL_TABLET | Freq: Once | ORAL | Status: AC
  Administered 2011-07-23: 500 mg via ORAL
  Filled 2011-07-23: qty 1

## 2011-07-23 MED ORDER — METRONIDAZOLE 500 MG PO TABS: 500.0000 mg | ORAL_TABLET | Freq: Three times a day (TID) | ORAL | Status: AC

## 2011-07-23 MED ORDER — DOXYCYCLINE HYCLATE 100 MG PO TABS
100.0000 mg | ORAL_TABLET | Freq: Once | ORAL | Status: AC
  Administered 2011-07-23: 100 mg via ORAL
  Filled 2011-07-23: qty 1

## 2011-07-23 MED ORDER — KETOROLAC TROMETHAMINE 30 MG/ML IJ SOLN
30.0000 mg | Freq: Once | INTRAMUSCULAR | Status: AC
  Administered 2011-07-23: 30 mg via INTRAMUSCULAR
  Filled 2011-07-23: qty 1

## 2011-07-23 NOTE — ED Provider Notes (Signed)
History     CSN: [id number]  Arrival date & time 07/23/11  0206   First MD Initiated Contact with Patient 07/23/11 0211      Chief Complaint  Patient presents with  . Vaginal Discharge    HPI This 21 year old female with a recent episode of bacterial vaginosis now presents with concerns of ongoing diffuse lower abdominal crampiness and vaginal discharge.  The patient's last menstrual period was 3 weeks ago, and she notes that until approximately 5-6 days ago she was in her usual state of health.  Since that time she's developed a more copious amount of discharge and diffuse, intermittent, though increasingly present, lower abdominal crampy sharp pain.  She notes no clear alleviating or exacerbating factors.  She denies any fevers, chills, vomiting, diarrhea.  She notes mild dysuria. She states that she was compliant with her medication after her episode of bacterial vaginosis, and has not yet followed up with her gynecologist. Past Medical History  Diagnosis Date  . HPV-genital warts   . Abnormal Pap smear   . History of chlamydia   . History of gonorrhea     Past Surgical History  Procedure Date  . Wisdom tooth extraction     Family History  Problem Relation Age of Onset  . Hypertension Mother   . Diabetes Maternal Aunt   . Hypertension Maternal Aunt   . Diabetes Maternal Grandmother   . Hypertension Maternal Grandmother   . Diabetes Cousin     History  Substance Use Topics  . Smoking status: Former Smoker -- 0.5 packs/day for 4 years    Types: Cigarettes    Quit date: 08/16/2010  . Smokeless tobacco: Not on file  . Alcohol Use: No    OB History    Grav Para Term Preterm Abortions TAB SAB Ect Mult Living   1 1 1  0 0 0 0 0 0 1      Review of Systems  Constitutional:       HPI  HENT:       HPI otherwise negative  Eyes: Negative.   Respiratory:       HPI, otherwise negative  Cardiovascular:       HPI, otherwise nmegative  Gastrointestinal: Negative for  vomiting.  Genitourinary:       HPI, otherwise negative  Musculoskeletal:       HPI, otherwise negative  Skin: Negative.   Neurological: Negative for syncope.    Allergies  Review of patient's allergies indicates no known allergies.  Home Medications   Current Outpatient Rx  Name Route Sig Dispense Refill  . DOXYCYCLINE HYCLATE 100 MG PO CAPS Oral Take 1 capsule (100 mg total) by mouth 2 (two) times daily. 28 capsule 0  . METRONIDAZOLE 500 MG PO TABS Oral Take 1 tablet (500 mg total) by mouth 3 (three) times daily. 42 tablet 0    BP 128/75  Pulse 61  Temp 98.2 F (36.8 C) (Oral)  Resp 18  SpO2 98%  LMP 07/02/2011  Breastfeeding? No  Physical Exam  Nursing note and vitals reviewed. Constitutional: She is oriented to person, place, and time. She appears well-developed and well-nourished. No distress.  HENT:  Head: Normocephalic and atraumatic.  Eyes: Conjunctivae and EOM are normal.  Cardiovascular: Normal rate and regular rhythm.   Pulmonary/Chest: Effort normal and breath sounds normal. No stridor. No respiratory distress.  Abdominal: She exhibits no distension.  Genitourinary: Pelvic exam was performed with patient supine. There is no rash, tenderness, lesion or injury  on the right labia. There is no rash, tenderness, lesion or injury on the left labia. Cervix exhibits no motion tenderness, no discharge and no friability. Right adnexum displays tenderness. Right adnexum displays no mass and no fullness. Left adnexum displays tenderness. Left adnexum displays no mass and no fullness. There is tenderness around the vagina. No erythema or bleeding around the vagina. No foreign body around the vagina. Vaginal discharge found.  Musculoskeletal: She exhibits no edema.  Lymphadenopathy:       Right: No inguinal adenopathy present.       Left: No inguinal adenopathy present.  Neurological: She is alert and oriented to person, place, and time. No cranial nerve deficit.  Skin: Skin  is warm and dry.  Psychiatric: She has a normal mood and affect.    ED Course  Procedures (including critical care time)  Labs Reviewed  URINALYSIS, ROUTINE W REFLEX MICROSCOPIC - Abnormal; Notable for the following:    Color, Urine AMBER (*)  BIOCHEMICALS MAY BE AFFECTED BY COLOR   APPearance CLOUDY (*)     Specific Gravity, Urine 1.036 (*)     Bilirubin Urine SMALL (*)     Ketones, ur 15 (*)     Protein, ur 30 (*)     Leukocytes, UA MODERATE (*)     All other components within normal limits  WET PREP, GENITAL - Abnormal; Notable for the following:    Clue Cells Wet Prep HPF POC FEW (*)     WBC, Wet Prep HPF POC MODERATE (*)     All other components within normal limits  PREGNANCY, URINE  URINE MICROSCOPIC-ADD ON  GC/CHLAMYDIA PROBE AMP, GENITAL   No results found.   1. Pelvic inflammatory disease       MDM  This 21 year old female now presents with concerns over ongoing diffuse lower abdominal pain, and new, worsening vaginal discharge.  On exam the patient is uncomfortable with palpation of her lower abdomen, though her vital signs are unremarkable.  Given this mild adnexal tenderness, her positive UA, the demonstration of clue cells and leukocytes, the patient was treated for pelvic inflammatory disease.  Absent any fever, evidence of distress the patient is appropriate for outpatient management of this condition.    Gerhard Munch, MD 07/23/11 [phone]

## 2011-07-23 NOTE — ED Notes (Signed)
PT. REPORTS VAGINAL DISCHARGE WITH LOW ABDOMINAL CRAMPING FOR 5 DAYS WITH SLIGHT NAUSEA . DENIES VOMITTING OR FEVER.

## 2011-07-23 NOTE — Discharge Instructions (Signed)
Pelvic Inflammatory Disease  Pelvic Inflammatory Disease (PID) is an infection in some or all of your female organs. This includes the womb (uterus), ovaries, fallopian tubes and tissues in the pelvis. PID is a common cause of sudden onset (acute) lower abdominal (pelvic) pain. PID can be treated, but it is a serious infection. It may take weeks before you are completely well. In some cases, hospitalization is needed for surgery or to administer medications to kill germs (antibiotics) through your veins (intravenously).  CAUSES    It may be caused by germs that are spread during sexual contact.   PID can also occur following:   The birth of a baby.   A miscarriage.   An abortion.   Major surgery of the pelvis.   Use of an IUD.   Sexual assault.  SYMPTOMS    Abdominal or pelvic pain.   Fever.   Chills.   Abnormal vaginal discharge.  DIAGNOSIS   Your caregiver will choose some of these methods to make a diagnosis:   A physical exam and history.   Blood tests.   Cultures of the vagina and cervix.   X-rays or ultrasound.   A procedure to look inside the pelvis (laparoscopy).  TREATMENT    Use of antibiotics by mouth or intravenously.   Treatment of sexual partners when the infection is an sexually transmitted disease (STD).   Hospitalization and surgery may be needed.  RISKS AND COMPLICATIONS    PID can cause women to become unable to have children (sterile) if left untreated or if partially treated. That is why it is important to finish all medications given to you.   Sterility or future tubal (ectopic) pregnancies can occur in fully treated individuals. This is why it is so important to follow your prescribed treatment.   It can cause longstanding (chronic) pelvic pain after frequent infections.   Painful intercourse.   Pelvic abscesses.   In rare cases, surgery or a hysterectomy may be needed.   If this is a sexually transmitted infection (STI), you are also at risk for any other STD  including AIDSor human papillomavirus (HPV).  HOME CARE INSTRUCTIONS    Finish all medication as prescribed. Incomplete treatment will put you at risk for sterility and tubal pregnancy.   Only take over-the-counter or prescription medicines for pain, discomfort, or fever as directed by your caregiver.   Do not have sex until treatment is completed or as directed by your caregiver. If PID is confirmed, your recent sexual contacts will need treatment.   Keep your follow-up appointments.  SEEK MEDICAL CARE IF:    You have increased or abnormal vaginal discharge.   You need prescription medication for your pain.   Your partner has an STD.   You are vomiting.   You cannot take your medications.  SEEK IMMEDIATE MEDICAL CARE IF:    You have a fever.   You develop increased abdominal or pelvic pain.   You develop chills.   You have pain when you urinate.   You are not better after 72 hours following treatment.  Document Released: 01/16/2005 Document Revised: 01/05/2011 Document Reviewed: 09/29/2006  ExitCare Patient Information 2012 [employer].

## 2011-07-23 NOTE — ED Notes (Signed)
Milky Vaginal discharge stated is watery, discharge stated 5 days ago, denies the discharge having any odor. Abdominal cramping started 4 days ago but getting worse, took 800mg  motrin strength and it will work for while and then the pain will come back.

## 2011-07-26 NOTE — ED Notes (Signed)
Patient returned call and was notified of positive results after id'd x 2 and informed of need to notify partner to be tested.

## 2011-07-26 NOTE — ED Notes (Addendum)
+   Chlamydia Patient treated with Doxycyline and Rocephin [facility] letter faxed Attempt made to contact patient by phone: Voice mail message left.

## 2011-09-13 ENCOUNTER — Inpatient Hospital Stay (HOSPITAL_COMMUNITY)
Admission: AD | Admit: 2011-09-13 | Discharge: 2011-09-13 | Discharge status: Against Medical Advice | Payer: Medicaid Other | Source: Ambulatory Visit | Attending: Obstetrics & Gynecology | Admitting: Obstetrics & Gynecology

## 2011-09-13 ENCOUNTER — Emergency Department (HOSPITAL_COMMUNITY)
Admission: EM | Admit: 2011-09-13 | Discharge: 2011-09-13 | Disposition: A | Discharge status: Home / Self Care | Payer: Medicaid Other | Source: Home / Self Care

## 2011-09-13 ENCOUNTER — Encounter (HOSPITAL_COMMUNITY): Payer: Self-pay

## 2011-09-13 ENCOUNTER — Encounter (HOSPITAL_COMMUNITY): Payer: Self-pay | Admitting: Emergency Medicine

## 2011-09-13 ENCOUNTER — Emergency Department (HOSPITAL_COMMUNITY)
Admission: EM | Admit: 2011-09-13 | Discharge: 2011-09-14 | Disposition: A | Discharge status: Home / Self Care | Payer: Medicaid Other | Attending: Emergency Medicine | Admitting: Emergency Medicine

## 2011-09-13 DIAGNOSIS — Z87891 Personal history of nicotine dependence: Secondary | ICD-10-CM | POA: Insufficient documentation

## 2011-09-13 DIAGNOSIS — R5383 Other fatigue: Secondary | ICD-10-CM | POA: Insufficient documentation

## 2011-09-13 DIAGNOSIS — R5381 Other malaise: Secondary | ICD-10-CM | POA: Insufficient documentation

## 2011-09-13 DIAGNOSIS — N898 Other specified noninflammatory disorders of vagina: Secondary | ICD-10-CM | POA: Insufficient documentation

## 2011-09-13 DIAGNOSIS — N739 Female pelvic inflammatory disease, unspecified: Secondary | ICD-10-CM | POA: Insufficient documentation

## 2011-09-13 DIAGNOSIS — R109 Unspecified abdominal pain: Secondary | ICD-10-CM | POA: Insufficient documentation

## 2011-09-13 DIAGNOSIS — N390 Urinary tract infection, site not specified: Secondary | ICD-10-CM | POA: Insufficient documentation

## 2011-09-13 DIAGNOSIS — N72 Inflammatory disease of cervix uteri: Secondary | ICD-10-CM | POA: Insufficient documentation

## 2011-09-13 LAB — URINALYSIS, ROUTINE W REFLEX MICROSCOPIC
Glucose, UA: NEGATIVE mg/dL
Hgb urine dipstick: NEGATIVE
Ketones, ur: NEGATIVE mg/dL
Protein, ur: NEGATIVE mg/dL

## 2011-09-13 LAB — CBC
MCHC: 31.1 g/dL (ref 30.0–36.0)
MCV: 83.2 fL (ref 78.0–100.0)
Platelets: 399 10*3/uL (ref 150–400)
RDW: 15.8 % — ABNORMAL HIGH (ref 11.5–15.5)
WBC: 13.4 10*3/uL — ABNORMAL HIGH (ref 4.0–10.5)

## 2011-09-13 LAB — URINE MICROSCOPIC-ADD ON

## 2011-09-13 LAB — POCT PREGNANCY, URINE: Preg Test, Ur: NEGATIVE

## 2011-09-13 NOTE — ED Notes (Signed)
Pt c/o lower abd pain L flank pain. Bloody vaginal discharge, not currently on menses. +N/V/D. Pt states she was seen in May for UTI and PID. Never filled antibiotics. Pt states she has been to [facility] and [facility] today, left after waiting too long per pt.

## 2011-09-13 NOTE — ED Notes (Signed)
Py reports she was seen here in May and dx w/UTI and PID, pt denies having her prescriptions filled. Pt reports increase lower abd pain radiating to bilateral flank area, vaginal odor and brown colored discharge, and weakness.

## 2011-09-13 NOTE — ED Notes (Signed)
Pt did not want to wait any longer for treatment. Signed AMA form

## 2011-09-13 NOTE — MAU Note (Signed)
Patient states she was diagnosed with PID and a UTI at the end of May or early June but did not get her medication filled due to the cost. States the abdominal pain continues and is having a brown discharge.

## 2011-09-14 LAB — URINE MICROSCOPIC-ADD ON

## 2011-09-14 LAB — URINALYSIS, ROUTINE W REFLEX MICROSCOPIC
Bilirubin Urine: NEGATIVE
Nitrite: NEGATIVE
Protein, ur: NEGATIVE mg/dL
Specific Gravity, Urine: 1.027 (ref 1.005–1.030)
Urobilinogen, UA: 0.2 mg/dL (ref 0.0–1.0)

## 2011-09-14 LAB — WET PREP, GENITAL
Trich, Wet Prep: NONE SEEN
Yeast Wet Prep HPF POC: NONE SEEN

## 2011-09-14 LAB — POCT PREGNANCY, URINE: Preg Test, Ur: NEGATIVE

## 2011-09-14 MED ORDER — CEFTRIAXONE SODIUM 250 MG IJ SOLR
250.0000 mg | Freq: Once | INTRAMUSCULAR | Status: AC
  Administered 2011-09-14: 250 mg via INTRAMUSCULAR
  Filled 2011-09-14: qty 250

## 2011-09-14 MED ORDER — AZITHROMYCIN 250 MG PO TABS
1000.0000 mg | ORAL_TABLET | Freq: Once | ORAL | Status: AC
  Administered 2011-09-14: 1000 mg via ORAL
  Filled 2011-09-14: qty 4

## 2011-09-14 NOTE — Discharge Instructions (Signed)
Cervicitis   Cervicitis is a soreness and swelling (inflammation) of the cervix. Your cervix is located at the bottom of your uterus which opens up to the vagina.   CAUSES   Sexually transmitted infections (STIs).   Allergic reaction.   Medicines or birth control devices that are put in the vagina.   Injury to the cervix.   Bacterial infections.   SYMPTOMS   There may be no symptoms. If symptoms occur, they may include:   Grey, white, yellow, or bad smelling vaginal discharge.   Pain or itching of the area outside the vagina.   Painful sexual intercourse.   Lower abdominal or lower back pain, especially during intercourse.   Frequent urination.   Abnormal vaginal bleeding between periods, after sexual intercourse, or after menopause.   Pressure or a heavy feeling in the pelvis.   DIAGNOSIS   Diagnosis is made after a pelvic exam. Other tests may include:   Examination of any discharge under a microscope (wet prep).   A Pap test.   TREATMENT   Treatment will depend on the cause of cervicitis. If it is caused by an STI, both you and your partner will need to be treated. Antibiotic medicines will be given.   HOME CARE INSTRUCTIONS   Do not have sexual intercourse until your caregiver says it is okay.   Do not have sexual intercourse until your partner has been treated if your cervicitis is caused by an STI.   Take your antibiotics as directed. Finish them even if you start to feel better.   SEEK IMMEDIATE MEDICAL CARE IF:   Your symptoms come back.   You have a fever.   You experience any problems that may be related to the medicine you are taking.   MAKE SURE YOU:   Understand these instructions.   Will watch your condition.   Will get help right away if you are not doing well or get worse.   Document Released: 01/16/2005 Document Revised: 01/05/2011 Document Reviewed: 08/15/2010   ExitCare Patient Information 2012 [employer].

## 2011-09-14 NOTE — ED Provider Notes (Signed)
History     CSN: [id number]  Arrival date & time 09/13/11  2202   First MD Initiated Contact with Patient 09/13/11 2312      Chief Complaint  Patient presents with  . Abdominal Pain     The history is provided by the patient.   the patient reports developing new vaginal discharge over the past week.  She's had urinary frequency without dysuria.  She denies nausea vomiting and diarrhea.  She reports her new vaginal discharge is brown and has a foul odor.  She has a prior history of gonorrhea which she reports was treated.  She reports she's using condoms with intercourse at all times.  She has a history of HPV genital warts.  She denies fevers or chills.  Her pain is mild in severity.  Past Medical History  Diagnosis Date  . HPV-genital warts   . Abnormal Pap smear   . History of chlamydia   . History of gonorrhea     Past Surgical History  Procedure Date  . Wisdom tooth extraction     Family History  Problem Relation Age of Onset  . Hypertension Mother   . Diabetes Maternal Aunt   . Hypertension Maternal Aunt   . Diabetes Maternal Grandmother   . Hypertension Maternal Grandmother   . Diabetes Cousin     History  Substance Use Topics  . Smoking status: Former Smoker -- 0.5 packs/day for 4 years    Types: Cigarettes    Quit date: 08/16/2010  . Smokeless tobacco: Not on file  . Alcohol Use: No    OB History    Grav Para Term Preterm Abortions TAB SAB Ect Mult Living   1 1 1  0 0 0 0 0 0 1      Review of Systems  Gastrointestinal: Positive for abdominal pain.  All other systems reviewed and are negative.    Allergies  Review of patient's allergies indicates no known allergies.  Home Medications   Current Outpatient Rx  Name Route Sig Dispense Refill  . IBUPROFEN 800 MG PO TABS Oral Take 800 mg by mouth every 8 (eight) hours as needed. pain    . RA GUMMY VITAMINS & MINERALS PO Oral Take 1 tablet by mouth daily.      BP 128/72  Pulse 104  Temp 100 F  (37.8 C) (Oral)  Resp 18  Ht 5\' 7"  (1.702 m)  Wt 168 lb (76.204 kg)  BMI 26.31 kg/m2  SpO2 100%  LMP 09/02/2011  Breastfeeding? No  Physical Exam  Nursing note and vitals reviewed. Constitutional: She is oriented to person, place, and time. She appears well-developed and well-nourished. No distress.  HENT:  Head: Normocephalic and atraumatic.  Eyes: EOM are normal.  Neck: Normal range of motion.  Cardiovascular: Normal rate, regular rhythm and normal heart sounds.   Pulmonary/Chest: Effort normal and breath sounds normal.  Abdominal: Soft. She exhibits no distension. There is no tenderness.  Genitourinary:       Cervical discharge with cervical erythema.  Cervical motion tenderness.  No peritonitis on exam.  No adnexal tenderness fullness or masses.  Vaginal discharge noted.  No herpetic lesions noted  Musculoskeletal: Normal range of motion.  Neurological: She is alert and oriented to person, place, and time.  Skin: Skin is warm and dry.  Psychiatric: She has a normal mood and affect. Judgment normal.    ED Course  Procedures (including critical care time)   Labs Reviewed  POCT PREGNANCY, URINE  URINALYSIS, ROUTINE W REFLEX MICROSCOPIC  GC/CHLAMYDIA PROBE AMP, GENITAL  WET PREP, GENITAL   No results found.   1. Cervicitis       MDM  I suspect this patient has cervicitis.  We'll discharge home after treatment for her cervicitis and emergency department.  She was instructed to followup at the health department for additional screening.  She's instructed to ask all partners to also be seen at the health department for additional screening before resuming intercourse.        Lyanne Co, MD 09/14/11 [phone]

## 2011-09-16 NOTE — ED Notes (Signed)
+   Chlamydia Patient treated per protocol MD.

## 2011-09-16 NOTE — ED Notes (Signed)
I left a message for the patient to return my call.

## 2011-09-18 NOTE — ED Notes (Signed)
I attempted to contact patient -no answer

## 2011-09-20 NOTE — ED Notes (Signed)
I have been unable to reach this patient by phone.  A letter is being sent.

## 2013-01-06 ENCOUNTER — Encounter: Payer: Self-pay | Admitting: Family Medicine

## 2013-01-06 ENCOUNTER — Encounter (HOSPITAL_COMMUNITY): Payer: Self-pay

## 2013-01-06 ENCOUNTER — Inpatient Hospital Stay (HOSPITAL_COMMUNITY)
Admission: AD | Admit: 2013-01-06 | Discharge: 2013-01-06 | Disposition: A | Discharge status: Home / Self Care | Payer: Medicaid Other | Source: Ambulatory Visit | Attending: Obstetrics & Gynecology | Admitting: Obstetrics & Gynecology

## 2013-01-06 DIAGNOSIS — B3731 Acute candidiasis of vulva and vagina: Secondary | ICD-10-CM | POA: Insufficient documentation

## 2013-01-06 DIAGNOSIS — O2390 Unspecified genitourinary tract infection in pregnancy, unspecified trimester: Secondary | ICD-10-CM | POA: Insufficient documentation

## 2013-01-06 DIAGNOSIS — B373 Candidiasis of vulva and vagina: Secondary | ICD-10-CM | POA: Insufficient documentation

## 2013-01-06 DIAGNOSIS — B9689 Other specified bacterial agents as the cause of diseases classified elsewhere: Secondary | ICD-10-CM

## 2013-01-06 DIAGNOSIS — R109 Unspecified abdominal pain: Secondary | ICD-10-CM | POA: Insufficient documentation

## 2013-01-06 DIAGNOSIS — N760 Acute vaginitis: Secondary | ICD-10-CM

## 2013-01-06 DIAGNOSIS — N898 Other specified noninflammatory disorders of vagina: Secondary | ICD-10-CM | POA: Insufficient documentation

## 2013-01-06 DIAGNOSIS — A499 Bacterial infection, unspecified: Secondary | ICD-10-CM

## 2013-01-06 DIAGNOSIS — O0933 Supervision of pregnancy with insufficient antenatal care, third trimester: Secondary | ICD-10-CM | POA: Insufficient documentation

## 2013-01-06 LAB — URINALYSIS, ROUTINE W REFLEX MICROSCOPIC
Bilirubin Urine: NEGATIVE
Glucose, UA: NEGATIVE mg/dL
Hgb urine dipstick: NEGATIVE
Ketones, ur: NEGATIVE mg/dL
Specific Gravity, Urine: 1.02 (ref 1.005–1.030)
pH: 7 (ref 5.0–8.0)

## 2013-01-06 LAB — OB RESULTS CONSOLE GBS: STREP GROUP B AG: POSITIVE

## 2013-01-06 LAB — URINE MICROSCOPIC-ADD ON

## 2013-01-06 LAB — WET PREP, GENITAL: Trich, Wet Prep: NONE SEEN

## 2013-01-06 LAB — RAPID URINE DRUG SCREEN, HOSP PERFORMED
Cocaine: NOT DETECTED
Opiates: NOT DETECTED
Tetrahydrocannabinol: POSITIVE — AB

## 2013-01-06 LAB — GROUP B STREP BY PCR: Group B strep by PCR: POSITIVE — AB

## 2013-01-06 MED ORDER — METRONIDAZOLE 500 MG PO TABS: 500.0000 mg | ORAL_TABLET | Freq: Two times a day (BID) | ORAL | Status: DC

## 2013-01-06 NOTE — MAU Note (Signed)
Pt states is here for increased abdominal pressure. States she is due 01/30/2013. Got [facility] in [place]. However has not been seen since End of September, early October. Has noted thick white mildly odorous vaginal discharge x2 weeks. Denies bleeding or gush of fluid.

## 2013-01-06 NOTE — Progress Notes (Signed)
Dr. Ike Bene notified of pt here for eval of abd pain/pressure and vaginal discharge. Will come see pt in MAU shortly

## 2013-01-06 NOTE — MAU Provider Note (Signed)
History     CSN: [id number]  Arrival date and time: 01/06/13 1326   First Provider Initiated Contact with Patient 01/06/13 1502      Chief Complaint  Patient presents with  . Abdominal Pain   HPI Chanteria Haggard is a 22 y.o. G2P1001 at [redacted]w[redacted]d by pt report. Pt rec'd [place] care in [place] untile end of September and hasnt been seen since. Pt is here today with complaints of vaginal discharge and pressure that is non contractile. Pt has no other complaints at this time and is otherwise healthy per report.  Pt denies VB, LOF, CTX, or decreased fetal movoement  Denies urinary symptoms, constipation or N/V, Diarhea, F/C, SOB, Weakness or headaches  OB History   Grav Para Term Preterm Abortions TAB SAB Ect Mult Living   2 1 1  0 0 0 0 0 0 1      Past Medical History  Diagnosis Date  . HPV-genital warts   . Abnormal Pap smear   . History of chlamydia   . History of gonorrhea     Past Surgical History  Procedure Laterality Date  . Wisdom tooth extraction      Family History  Problem Relation Age of Onset  . Hypertension Mother   . Diabetes Maternal Aunt   . Hypertension Maternal Aunt   . Diabetes Maternal Grandmother   . Hypertension Maternal Grandmother   . Diabetes Cousin     History  Substance Use Topics  . Smoking status: Former Smoker -- 0.50 packs/day for 4 years    Types: Cigarettes    Quit date: 08/16/2010  . Smokeless tobacco: Never Used  . Alcohol Use: No    Allergies: No Known Allergies  No prescriptions prior to admission    ROS See above Physical Exam   Blood pressure 113/65, pulse 103, temperature 98.2 F (36.8 C), temperature source Oral, resp. rate 18, height 5\' 7"  (1.702 m), weight 84.936 kg (187 lb 4 oz), SpO2 100.00%, not currently breastfeeding.  Physical Exam VSS NAD External vaginal with numerous papules and plaques on bilateral labia. (hx of HPV flared since pregnant) SVE: closed cervical os on instpection, moderate white vaginal discharge,  non malodorous No c/c/e  FHT:  120s mod var, several 15x15 accels, no decels Toco: no ctx  Results for JAELEEN, INZUNZA (MRN [id number]) as of 01/06/2013 15:19  Ref. Range 01/06/2013 13:43  Color, Urine Latest Range: YELLOW  YELLOW  APPearance Latest Range: CLEAR  CLEAR  Specific Gravity, Urine Latest Range: 1.005-1.030  1.020  pH Latest Range: 5.0-8.0  7.0  Glucose Latest Range: NEGATIVE mg/dL NEGATIVE  Bilirubin Urine Latest Range: NEGATIVE  NEGATIVE  Ketones, ur Latest Range: NEGATIVE mg/dL NEGATIVE  Protein Latest Range: NEGATIVE mg/dL NEGATIVE  Urobilinogen, UA Latest Range: 0.0-1.0 mg/dL 0.2  Nitrite Latest Range: NEGATIVE  NEGATIVE  Leukocytes, UA Latest Range: NEGATIVE  SMALL (A)  Hgb urine dipstick Latest Range: NEGATIVE  NEGATIVE  Urine-Other No range found RARE YEAST  WBC, UA Latest Range: <3 WBC/hpf 0-2  RBC / HPF Latest Range: <3 RBC/hpf 0-2  Squamous Epithelial / LPF Latest Range: RARE  MANY (A)  Bacteria, UA Latest Range: RARE  FEW (A)  Results for GAILENE, YOUKHANA (MRN [id number]) as of 01/06/2013 15:54  Ref. Range 01/06/2013 15:08  Yeast Wet Prep HPF POC Latest Range: NONE SEEN  NONE SEEN  Trich, Wet Prep Latest Range: NONE SEEN  NONE SEEN  Clue Cells Wet Prep HPF POC Latest Range: NONE SEEN  MODERATE (A)  WBC, Wet Prep HPF POC Latest Range: NONE SEEN  MANY (A)    MAU Course  Procedures  MDM Will collect GC/C, GBS, Wet mount.  Assessment and Plan  Sharnise Blough is a 22 y.o. G2P1001 at [redacted]w[redacted]d by pt report here to establish care and be evaluated for vaginal pressure and discharge. Closed cervix, reassuring NST, Labs with rare yeast in urine, likely contaminant not seen on wet mount. +Clue cells tx. Flagyl 500mg  BID x 7d. Will discharge home with follow up message sent to clinic.  Tawana Scale 01/06/2013, 3:13 PM

## 2013-01-07 LAB — GC/CHLAMYDIA PROBE AMP
CT Probe RNA: NEGATIVE
GC Probe RNA: NEGATIVE

## 2013-01-07 LAB — OB RESULTS CONSOLE GC/CHLAMYDIA
Chlamydia: NEGATIVE
GC PROBE AMP, GENITAL: NEGATIVE

## 2013-01-14 ENCOUNTER — Ambulatory Visit (INDEPENDENT_AMBULATORY_CARE_PROVIDER_SITE_OTHER): Payer: Medicaid Other | Admitting: Obstetrics and Gynecology

## 2013-01-14 ENCOUNTER — Encounter: Payer: Self-pay | Admitting: *Deleted

## 2013-01-14 ENCOUNTER — Encounter: Payer: Self-pay | Admitting: Obstetrics and Gynecology

## 2013-01-14 VITALS — BP 132/82 | Temp 97.8°F | Wt 193.6 lb

## 2013-01-14 DIAGNOSIS — O99820 Streptococcus B carrier state complicating pregnancy: Secondary | ICD-10-CM | POA: Insufficient documentation

## 2013-01-14 DIAGNOSIS — Z23 Encounter for immunization: Secondary | ICD-10-CM

## 2013-01-14 DIAGNOSIS — O0933 Supervision of pregnancy with insufficient antenatal care, third trimester: Secondary | ICD-10-CM

## 2013-01-14 DIAGNOSIS — Z22330 Carrier of Group B streptococcus: Secondary | ICD-10-CM

## 2013-01-14 DIAGNOSIS — O0930 Supervision of pregnancy with insufficient antenatal care, unspecified trimester: Secondary | ICD-10-CM

## 2013-01-14 DIAGNOSIS — O09899 Supervision of other high risk pregnancies, unspecified trimester: Secondary | ICD-10-CM

## 2013-01-14 LAB — POCT URINALYSIS DIP (DEVICE)
Bilirubin Urine: NEGATIVE
Nitrite: NEGATIVE
Protein, ur: NEGATIVE mg/dL
pH: 7 (ref 5.0–8.0)

## 2013-01-14 MED ORDER — TETANUS-DIPHTH-ACELL PERTUSSIS 5-2.5-18.5 LF-MCG/0.5 IM SUSP
0.5000 mL | Freq: Once | INTRAMUSCULAR | Status: AC
  Administered 2013-01-14: 0.5 mL via INTRAMUSCULAR

## 2013-01-14 NOTE — Progress Notes (Signed)
P= 91 Pt. States she has had a pap and ultrasound done in [place], [place] for this pregnancy. Unsure of what lab work she has had done. Release of information signed and will request records. 1hr gtt today and OB panel.  Tdap today.

## 2013-01-14 NOTE — Addendum Note (Signed)
Addended by: Franchot Mimes on: 01/14/2013 09:59 AM   Modules accepted: Orders

## 2013-01-14 NOTE — Progress Notes (Signed)
Patient transferred care from [place]. Last prenatal visit was in September. No prenatal records available for review. Patient reports uncomplicated prenatal care. FM/labor precautions reviewed. Labs ordered today including 1 hr GCT. Will schedule ultrasound

## 2013-01-15 ENCOUNTER — Encounter: Payer: Self-pay | Admitting: Obstetrics and Gynecology

## 2013-01-15 ENCOUNTER — Ambulatory Visit (HOSPITAL_COMMUNITY)
Admission: RE | Admit: 2013-01-15 | Discharge: 2013-01-15 | Disposition: A | Discharge status: Home / Self Care | Payer: Medicaid Other | Source: Ambulatory Visit | Attending: Obstetrics and Gynecology | Admitting: Obstetrics and Gynecology

## 2013-01-15 DIAGNOSIS — O0933 Supervision of pregnancy with insufficient antenatal care, third trimester: Secondary | ICD-10-CM

## 2013-01-15 DIAGNOSIS — Z3689 Encounter for other specified antenatal screening: Secondary | ICD-10-CM | POA: Insufficient documentation

## 2013-01-15 DIAGNOSIS — O0930 Supervision of pregnancy with insufficient antenatal care, unspecified trimester: Secondary | ICD-10-CM | POA: Insufficient documentation

## 2013-01-15 LAB — OBSTETRIC PANEL
Antibody Screen: NEGATIVE
Basophils Absolute: 0.1 10*3/uL (ref 0.0–0.1)
Basophils Relative: 0 % (ref 0–1)
Eosinophils Absolute: 0.2 10*3/uL (ref 0.0–0.7)
Hemoglobin: 10.9 g/dL — ABNORMAL LOW (ref 12.0–15.0)
Hepatitis B Surface Ag: NEGATIVE
MCH: 27.5 pg (ref 26.0–34.0)
MCHC: 32.5 g/dL (ref 30.0–36.0)
Monocytes Absolute: 0.8 10*3/uL (ref 0.1–1.0)
Monocytes Relative: 7 % (ref 3–12)
Neutrophils Relative %: 70 % (ref 43–77)
RDW: 14.4 % (ref 11.5–15.5)
Rh Type: POSITIVE

## 2013-01-16 ENCOUNTER — Encounter: Payer: Self-pay | Admitting: Obstetrics and Gynecology

## 2013-01-16 LAB — PRESCRIPTION MONITORING PROFILE (19 PANEL)
Buprenorphine, Urine: NEGATIVE ng/mL
Carisoprodol, Urine: NEGATIVE ng/mL
Fentanyl, Ur: NEGATIVE ng/mL
MDMA URINE: NEGATIVE ng/mL
Meperidine, Ur: NEGATIVE ng/mL
Methadone Screen, Urine: NEGATIVE ng/mL
Methaqualone: NEGATIVE ng/mL
Phencyclidine, Ur: NEGATIVE ng/mL

## 2013-01-17 ENCOUNTER — Telehealth: Payer: Self-pay | Admitting: Advanced Practice Midwife

## 2013-01-17 LAB — CULTURE, OB URINE: Colony Count: 30000

## 2013-01-17 MED ORDER — TERCONAZOLE 0.4 % VA CREA: 1.0000 | TOPICAL_CREAM | Freq: Every day | VAGINAL | Status: DC

## 2013-01-17 MED ORDER — FLUCONAZOLE 150 MG PO TABS: 150.0000 mg | ORAL_TABLET | Freq: Once | ORAL | Status: DC

## 2013-01-17 NOTE — Telephone Encounter (Signed)
Pt called to report symptoms of yeast infection including thick white clumping vaginal discharge, vaginal itching, and erythema/edema of vulva.  Pt has hx of yeast infection when on abx.  Flagyl prescribed at her visit in clinic this week.  Diflucan 150 mg x2 doses and Terazol cream called to pt pharmacy.  F/u at appt on Tuesday.

## 2013-01-21 ENCOUNTER — Ambulatory Visit (INDEPENDENT_AMBULATORY_CARE_PROVIDER_SITE_OTHER): Payer: Medicaid Other | Admitting: Obstetrics & Gynecology

## 2013-01-21 VITALS — BP 122/71 | Temp 97.2°F | Wt 194.2 lb

## 2013-01-21 DIAGNOSIS — O0930 Supervision of pregnancy with insufficient antenatal care, unspecified trimester: Secondary | ICD-10-CM

## 2013-01-21 DIAGNOSIS — O0933 Supervision of pregnancy with insufficient antenatal care, third trimester: Secondary | ICD-10-CM

## 2013-01-21 LAB — POCT URINALYSIS DIP (DEVICE)
Bilirubin Urine: NEGATIVE
Hgb urine dipstick: NEGATIVE
Nitrite: NEGATIVE
Urobilinogen, UA: 0.2 mg/dL (ref 0.0–1.0)
pH: 7 (ref 5.0–8.0)

## 2013-01-21 NOTE — Patient Instructions (Signed)

## 2013-01-21 NOTE — Progress Notes (Signed)
P=117  Pt reports pressure/irregular contractions this past week.

## 2013-01-21 NOTE — Progress Notes (Signed)
Genital warts noted.

## 2013-01-28 ENCOUNTER — Ambulatory Visit (INDEPENDENT_AMBULATORY_CARE_PROVIDER_SITE_OTHER): Payer: Medicaid Other | Admitting: Family Medicine

## 2013-01-28 VITALS — BP 114/69 | Temp 98.6°F | Wt 195.7 lb

## 2013-01-28 DIAGNOSIS — O0930 Supervision of pregnancy with insufficient antenatal care, unspecified trimester: Secondary | ICD-10-CM

## 2013-01-28 DIAGNOSIS — Z3490 Encounter for supervision of normal pregnancy, unspecified, unspecified trimester: Secondary | ICD-10-CM

## 2013-01-28 DIAGNOSIS — O0933 Supervision of pregnancy with insufficient antenatal care, third trimester: Secondary | ICD-10-CM

## 2013-01-28 LAB — POCT URINALYSIS DIP (DEVICE)
Bilirubin Urine: NEGATIVE
Glucose, UA: NEGATIVE mg/dL
Hgb urine dipstick: NEGATIVE
Ketones, ur: NEGATIVE mg/dL
Nitrite: NEGATIVE
Protein, ur: NEGATIVE mg/dL
Specific Gravity, Urine: 1.01 (ref 1.005–1.030)
Urobilinogen, UA: 0.2 mg/dL (ref 0.0–1.0)
pH: 7 (ref 5.0–8.0)

## 2013-01-28 NOTE — Progress Notes (Signed)
p-93 

## 2013-01-28 NOTE — Progress Notes (Signed)
No lof no vb no ctx normal FM  Laura Santos is a 22 y.o. G2P1001 at [redacted]w[redacted]d here for ROB visit.    Membranes stripped Reviewed [place], congruent with dates  Discussed with Patient:  - Plans to bottle feed.  All questions answered. - Continue prenatal vitamins. - Reviewed fetal kick counts Pt to perform daily at a time when the baby is active, lie laterally with both hands on belly in quiet room and count all movements (hiccups, shoulder rolls, obvious kicks, etc); pt is to report to clinic MAU for less than 10 movements felt in a 2 hour time period-pt told as soon as she counts 10 movements the count is complete.  - Routine precautions discussed (depression, infection s/s).   Patient provided with all pertinent phone numbers for emergencies. - RTC for any VB, regular, painful cramps/ctxs occurring at a rate of >2/10 min, fever (100.5 or higher), n/v/d, any pain that is unresolving or worsening, LOF, decreased fetal movement, CP, SOB, edema -RTC in one week for next visit.  Problems: Patient Active Problem List   Diagnosis Date Noted  . GBS (group B Streptococcus carrier), +RV culture, currently pregnant 01/14/2013  . Insufficient prenatal care in third trimester 01/06/2013    To Do: 1.   [ ]  Vaccines: GEX:BMWUXLKG  Tdap: recd [ ]  BCM: mirena [ ]  Readiness: baby has a place to sleep, car seat, other baby necessities.  Edu: [x ] PTL precautions; [ ]  BF class; [ ]  childbirth class; [ ]   BF counseling;

## 2013-01-30 NOTE — L&D Delivery Note (Signed)
Delivery Note At 2:57 AM a viable female was delivered via Vaginal, Spontaneous Delivery (Presentation: Left Occiput Anterior).  APGAR: 9, 9; weight TBD.   Placenta status: Intact, Spontaneous.  Cord: 3 vessels with the following complications: None.  Cord pH: 7.06  Anesthesia: Epidural  Episiotomy: None Lacerations: left labial laceration requiring repair for hemostasis.  Suture Repair: 3.0 vicryl Est. Blood Loss (mL): 300cc  Mom to postpartum.  Baby to Couplet care / Skin to Skin.  Baby with significant deep variables on HR for the last hour that would improve partially with certain position changes. Periods of both marked and moderate variability.  AROM performed despite inadequate PCN prophylaxis given the fetal HR and internal monitors were placed. She progressed quickly from 7cm tocomplete in that hour and pushed to rapidly deliver a liveborn female infant via NSVD over intact perineum.  No nuchal cord. Baby with spontaneous cry and was placed on mom skin-to-skin immediately.  Apgars were 9 and 9. Cord pH was 7.06. Cord was clamped and cut by baby's godmother. Placenta delivered spontaneously intact with 3V cord. Left labial tear repaired for hemostasis with 3-0 vicryl.  No other complications.    Andres Escandon L 02/03/2013, 3:50 AM

## 2013-02-02 ENCOUNTER — Inpatient Hospital Stay (HOSPITAL_COMMUNITY): Payer: Medicaid Other | Admitting: Anesthesiology

## 2013-02-02 ENCOUNTER — Inpatient Hospital Stay (HOSPITAL_COMMUNITY)
Admission: AD | Admit: 2013-02-02 | Discharge: 2013-02-05 | DRG: 775 | Disposition: A | Discharge status: Home / Self Care | Payer: Medicaid Other | Source: Ambulatory Visit | Attending: Obstetrics & Gynecology | Admitting: Obstetrics & Gynecology

## 2013-02-02 ENCOUNTER — Encounter (HOSPITAL_COMMUNITY): Payer: Medicaid Other | Admitting: Anesthesiology

## 2013-02-02 ENCOUNTER — Encounter (HOSPITAL_COMMUNITY): Payer: Self-pay | Admitting: *Deleted

## 2013-02-02 DIAGNOSIS — O9989 Other specified diseases and conditions complicating pregnancy, childbirth and the puerperium: Secondary | ICD-10-CM

## 2013-02-02 DIAGNOSIS — O99892 Other specified diseases and conditions complicating childbirth: Secondary | ICD-10-CM | POA: Diagnosis present

## 2013-02-02 DIAGNOSIS — Z3490 Encounter for supervision of normal pregnancy, unspecified, unspecified trimester: Secondary | ICD-10-CM

## 2013-02-02 DIAGNOSIS — IMO0001 Reserved for inherently not codable concepts without codable children: Secondary | ICD-10-CM

## 2013-02-02 DIAGNOSIS — Z22330 Carrier of Group B streptococcus: Secondary | ICD-10-CM

## 2013-02-02 LAB — CBC
HCT: 35.4 % — ABNORMAL LOW (ref 36.0–46.0)
HEMOGLOBIN: 11.6 g/dL — AB (ref 12.0–15.0)
MCH: 27.2 pg (ref 26.0–34.0)
MCHC: 32.8 g/dL (ref 30.0–36.0)
MCV: 83.1 fL (ref 78.0–100.0)
Platelets: 287 10*3/uL (ref 150–400)
RBC: 4.26 MIL/uL (ref 3.87–5.11)
RDW: 14.7 % (ref 11.5–15.5)
WBC: 15.9 10*3/uL — ABNORMAL HIGH (ref 4.0–10.5)

## 2013-02-02 MED ORDER — EPHEDRINE 5 MG/ML INJ: 10.0000 mg | INTRAVENOUS | Status: DC | PRN

## 2013-02-02 MED ORDER — LACTATED RINGERS IV SOLN: 500.0000 mL | Freq: Once | INTRAVENOUS | Status: DC

## 2013-02-02 MED ORDER — PHENYLEPHRINE 40 MCG/ML (10ML) SYRINGE FOR IV PUSH (FOR BLOOD PRESSURE SUPPORT): 80.0000 ug | PREFILLED_SYRINGE | INTRAVENOUS | Status: DC | PRN

## 2013-02-02 MED ORDER — PENICILLIN G POTASSIUM 5000000 UNITS IJ SOLR
5.0000 10*6.[IU] | Freq: Once | INTRAMUSCULAR | Status: AC
  Administered 2013-02-02: 5 10*6.[IU] via INTRAVENOUS
  Filled 2013-02-02: qty 5

## 2013-02-02 MED ORDER — LIDOCAINE HCL (PF) 1 % IJ SOLN
30.0000 mL | INTRAMUSCULAR | Status: DC | PRN
  Filled 2013-02-02: qty 30

## 2013-02-02 MED ORDER — LACTATED RINGERS IV SOLN
INTRAVENOUS | Status: DC
  Administered 2013-02-02: 23:00:00 via INTRAVENOUS

## 2013-02-02 MED ORDER — CITRIC ACID-SODIUM CITRATE 334-500 MG/5ML PO SOLN: 30.0000 mL | ORAL | Status: DC | PRN

## 2013-02-02 MED ORDER — PHENYLEPHRINE 40 MCG/ML (10ML) SYRINGE FOR IV PUSH (FOR BLOOD PRESSURE SUPPORT)
80.0000 ug | PREFILLED_SYRINGE | INTRAVENOUS | Status: DC | PRN
  Filled 2013-02-02: qty 10

## 2013-02-02 MED ORDER — PENICILLIN G POTASSIUM 5000000 UNITS IJ SOLR
2.5000 10*6.[IU] | INTRAVENOUS | Status: DC
  Filled 2013-02-02 (×4): qty 2.5

## 2013-02-02 MED ORDER — IBUPROFEN 600 MG PO TABS: 600.0000 mg | ORAL_TABLET | Freq: Four times a day (QID) | ORAL | Status: DC | PRN

## 2013-02-02 MED ORDER — LACTATED RINGERS IV SOLN: 500.0000 mL | INTRAVENOUS | Status: DC | PRN

## 2013-02-02 MED ORDER — DIPHENHYDRAMINE HCL 50 MG/ML IJ SOLN: 12.5000 mg | INTRAMUSCULAR | Status: DC | PRN

## 2013-02-02 MED ORDER — FENTANYL CITRATE 0.05 MG/ML IJ SOLN
100.0000 ug | INTRAMUSCULAR | Status: DC | PRN
  Administered 2013-02-02: 100 ug via INTRAVENOUS
  Filled 2013-02-02: qty 2

## 2013-02-02 MED ORDER — EPHEDRINE 5 MG/ML INJ
10.0000 mg | INTRAVENOUS | Status: DC | PRN
  Filled 2013-02-02: qty 4

## 2013-02-02 MED ORDER — ONDANSETRON HCL 4 MG/2ML IJ SOLN: 4.0000 mg | Freq: Four times a day (QID) | INTRAMUSCULAR | Status: DC | PRN

## 2013-02-02 MED ORDER — FENTANYL 2.5 MCG/ML BUPIVACAINE 1/10 % EPIDURAL INFUSION (WH - ANES)
14.0000 mL/h | INTRAMUSCULAR | Status: DC | PRN
  Administered 2013-02-03: 14 mL/h via EPIDURAL
  Filled 2013-02-02: qty 125

## 2013-02-02 MED ORDER — FLEET ENEMA 7-19 GM/118ML RE ENEM: 1.0000 | ENEMA | RECTAL | Status: DC | PRN

## 2013-02-02 MED ORDER — OXYCODONE-ACETAMINOPHEN 5-325 MG PO TABS: 1.0000 | ORAL_TABLET | ORAL | Status: DC | PRN

## 2013-02-02 MED ORDER — OXYTOCIN BOLUS FROM INFUSION: 500.0000 mL | INTRAVENOUS | Status: DC

## 2013-02-02 MED ORDER — OXYTOCIN 40 UNITS IN LACTATED RINGERS INFUSION - SIMPLE MED
62.5000 mL/h | INTRAVENOUS | Status: DC
  Filled 2013-02-02: qty 1000

## 2013-02-02 MED ORDER — ACETAMINOPHEN 325 MG PO TABS: 650.0000 mg | ORAL_TABLET | ORAL | Status: DC | PRN

## 2013-02-02 NOTE — Anesthesia Preprocedure Evaluation (Signed)

## 2013-02-02 NOTE — MAU Note (Signed)
Pt started contracting Q 7 min 1 1/2 hrs ago and now contractions are every 2-3 minutes. Denies bleeding or leaking. Reports good fetal movement.

## 2013-02-02 NOTE — H&P (Signed)
Laura Santos is a 23 y.o. female G2P1001 with IUP at 385w3d presenting for labor at term. Pt states she has been having regular, every 2 minutes contractions for the last 3 hours. No vb, lof. +FM.   [facility] [place].  She believes her pregnancy to have been uncomplicated. Late US here agrees with dating.  Labs unremarkable. GBS +.    Prenatal History/Complications:  Past Medical History: Past Medical History  Diagnosis Date  . HPV-genital warts   . Abnormal Pap smear   . History of chlamydia   . History of gonorrhea     Past Surgical History: Past Surgical History  Procedure Laterality Date  . Wisdom tooth extraction      Obstetrical History: OB History   Grav Para Term Preterm Abortions TAB SAB Ect Mult Living   2 1 1  0 0 0 0 0 0 1    G1- term, VAVD (for cord around neck per pt).  7lb 2 oz G2- current.   Social History: History   Social History  . Marital Status: Single    Spouse Name: N/A    Number of Children: N/A  . Years of Education: N/A   Social History Main Topics  . Smoking status: Former Smoker -- 0.50 packs/day for 4 years    Types: Cigarettes    Quit date: 08/16/2010  . Smokeless tobacco: Never Used  . Alcohol Use: No  . Drug Use: No  . Sexual Activity: Yes    Birth Control/ Protection: None   Other Topics Concern  . None   Social History Narrative  . None    Family History: Family History  Problem Relation Age of Onset  . Hypertension Mother   . Diabetes Maternal Aunt   . Hypertension Maternal Aunt   . Diabetes Maternal Grandmother   . Hypertension Maternal Grandmother   . Diabetes Cousin     Allergies: No Known Allergies  No prescriptions prior to admission     Review of Systems  All systems reviewed and negative except as stated in HPI    Blood pressure 115/75, pulse 121, temperature 98.6 F (37 C), temperature source Oral, resp. rate 20, SpO2 100.00%. General  appearance: alert, cooperative and no distress Lungs: clear to auscultation bilaterally Heart: regular rate and rhythm Abdomen: soft, non-tender; bowel sounds normal Pelvic: 4.5/90/-1 per nursing in MAU Extremities: Homans sign is negative, no sign of DVT DTR's normal Presentation: cephalic per nursing Fetal monitoringBaseline: 120 bpm, Variability: Good {> 6 bpm), Accelerations: Reactive and Decelerations: Absent Uterine activity regular every 2-594min  Dilation: 4.5 Effacement (%): 90 Station: -2 Exam by:: Elie ConferK. Weiss RN   Prenatal labs: ABO, Rh: O/POS/-- (12/16 0954) Antibody: NEG (12/16 0954) Rubella:   RPR: NON REAC (12/16 0954)  HBsAg: NEGATIVE (12/16 0954)  HIV: NON REACTIVE (12/16 0954)  GBS:    1 hr Glucola 98 Genetic screening  unknown Anatomy US normal. Limited due to advanced gestational age.   .resultrcnt[24h  Assessment: Laura Santos is a 23 y.o. G2P1001 with an IUP at 295w3d presenting for early labor at term.   Plan: 1) labor - admit to L&D - routine orders - monitor for now  2) FWB - cat I tracing - GBS + will start PCN - EFW 7#  3) anticipate SVD    Hasheem Voland L, MD 02/02/2013, 10:12 PM

## 2013-02-03 ENCOUNTER — Encounter: Payer: Self-pay | Admitting: Nurse Practitioner

## 2013-02-03 ENCOUNTER — Encounter (HOSPITAL_COMMUNITY): Payer: Self-pay | Admitting: *Deleted

## 2013-02-03 DIAGNOSIS — O9989 Other specified diseases and conditions complicating pregnancy, childbirth and the puerperium: Secondary | ICD-10-CM

## 2013-02-03 DIAGNOSIS — Z22330 Carrier of Group B streptococcus: Secondary | ICD-10-CM

## 2013-02-03 DIAGNOSIS — O99892 Other specified diseases and conditions complicating childbirth: Secondary | ICD-10-CM

## 2013-02-03 LAB — RPR: RPR Ser Ql: NONREACTIVE

## 2013-02-03 MED ORDER — BENZOCAINE-MENTHOL 20-0.5 % EX AERO
1.0000 "application " | INHALATION_SPRAY | CUTANEOUS | Status: DC | PRN
  Filled 2013-02-03 (×2): qty 56

## 2013-02-03 MED ORDER — DIBUCAINE 1 % RE OINT
1.0000 "application " | TOPICAL_OINTMENT | RECTAL | Status: DC | PRN
  Filled 2013-02-03: qty 28

## 2013-02-03 MED ORDER — LANOLIN HYDROUS EX OINT
TOPICAL_OINTMENT | CUTANEOUS | Status: DC | PRN
Start: 2013-02-03 — End: 2013-02-05

## 2013-02-03 MED ORDER — OXYCODONE-ACETAMINOPHEN 5-325 MG PO TABS
1.0000 | ORAL_TABLET | ORAL | Status: DC | PRN
  Administered 2013-02-04 (×2): 1 via ORAL
  Filled 2013-02-03 (×2): qty 1

## 2013-02-03 MED ORDER — ONDANSETRON HCL 4 MG PO TABS: 4.0000 mg | ORAL_TABLET | ORAL | Status: DC | PRN

## 2013-02-03 MED ORDER — SIMETHICONE 80 MG PO CHEW: 80.0000 mg | CHEWABLE_TABLET | ORAL | Status: DC | PRN

## 2013-02-03 MED ORDER — WITCH HAZEL-GLYCERIN EX PADS: 1.0000 "application " | MEDICATED_PAD | CUTANEOUS | Status: DC | PRN

## 2013-02-03 MED ORDER — ZOLPIDEM TARTRATE 5 MG PO TABS: 5.0000 mg | ORAL_TABLET | Freq: Every evening | ORAL | Status: DC | PRN

## 2013-02-03 MED ORDER — IBUPROFEN 600 MG PO TABS
600.0000 mg | ORAL_TABLET | Freq: Four times a day (QID) | ORAL | Status: DC
  Administered 2013-02-03 – 2013-02-05 (×8): 600 mg via ORAL
  Filled 2013-02-03 (×9): qty 1

## 2013-02-03 MED ORDER — PRENATAL MULTIVITAMIN CH
1.0000 | ORAL_TABLET | Freq: Every day | ORAL | Status: DC
  Administered 2013-02-03 – 2013-02-04 (×2): 1 via ORAL
  Filled 2013-02-03 (×2): qty 1

## 2013-02-03 MED ORDER — ONDANSETRON HCL 4 MG/2ML IJ SOLN: 4.0000 mg | INTRAMUSCULAR | Status: DC | PRN

## 2013-02-03 MED ORDER — TETANUS-DIPHTH-ACELL PERTUSSIS 5-2.5-18.5 LF-MCG/0.5 IM SUSP: 0.5000 mL | Freq: Once | INTRAMUSCULAR | Status: DC

## 2013-02-03 MED ORDER — MEASLES, MUMPS & RUBELLA VAC ~~LOC~~ INJ
0.5000 mL | INJECTION | Freq: Once | SUBCUTANEOUS | Status: DC
  Filled 2013-02-03: qty 0.5

## 2013-02-03 MED ORDER — SODIUM BICARBONATE 8.4 % IV SOLN
INTRAVENOUS | Status: DC | PRN
  Administered 2013-02-03 (×2): 5 mL via EPIDURAL

## 2013-02-03 MED ORDER — DIPHENHYDRAMINE HCL 25 MG PO CAPS: 25.0000 mg | ORAL_CAPSULE | Freq: Four times a day (QID) | ORAL | Status: DC | PRN

## 2013-02-03 MED ORDER — SENNOSIDES-DOCUSATE SODIUM 8.6-50 MG PO TABS
2.0000 | ORAL_TABLET | ORAL | Status: DC
  Administered 2013-02-04 (×2): 2 via ORAL
  Filled 2013-02-03 (×2): qty 2

## 2013-02-03 NOTE — H&P (Signed)
Attestation of Attending Supervision of Fellow: Evaluation and management procedures were performed by the Fellow under my supervision and collaboration.  I have reviewed the Fellow's note and chart, and I agree with the management and plan.    

## 2013-02-03 NOTE — Progress Notes (Signed)
Laura Santos is a 23 y.o. G2P1001 at 3133w4d  admitted for active labor  Subjective: Pt now s/p epidural and more comfortable. Took 2 epidurals. +FM. No LOF.   Objective: BP 127/68  Pulse 81  Temp(Src) 98.6 F (37 C) (Oral)  Resp 20  Ht 5\' 7"  (1.702 m)  Wt 88.451 kg (195 lb)  BMI 30.53 kg/m2  SpO2 100%      FHT:  FHR: 120 bpm, variability: moderate,  accelerations:  Present,  decelerations:  Present variable UC:   regular, every 2 minutes SVE:   Dilation: 7 Effacement (%): 90 Station:BBOW Exam by:: GPayne, RN  Labs: Lab Results  Component Value Date   WBC 15.9* 02/02/2013   HGB 11.6* 02/02/2013   HCT 35.4* 02/02/2013   MCV 83.1 02/02/2013   PLT 287 02/02/2013    Assessment / Plan: active labor at term  Labor: Progressing normally Fetal Wellbeing:  Category II Pain Control:  Epidural I/D:  GBS positive- now just finished first bag of PCN  Anticipated MOD:  NSVD  Clessie Karras L 02/03/2013, 1:44 AM

## 2013-02-03 NOTE — Anesthesia Procedure Notes (Addendum)
Epidural Patient location during procedure: OB  Preanesthetic Checklist Completed: patient identified, site marked, surgical consent, pre-op evaluation, timeout performed, IV checked, risks and benefits discussed and monitors and equipment checked  Epidural Patient position: sitting Prep: site prepped and draped and DuraPrep Patient monitoring: continuous pulse ox and blood pressure Approach: midline Injection technique: LOR air  Needle:  Needle type: Tuohy  Needle gauge: 17 G Needle length: 9 cm and 9 Needle insertion depth: 5 cm cm Catheter type: closed end flexible Catheter size: 19 Gauge Catheter at skin depth: 10 cm Test dose: negative  Assessment Events: blood not aspirated, injection not painful, no injection resistance, negative IV test and no paresthesia  Additional Notes Dosing of Epidural:  1st dose, through catheter ............................................Marland Kitchen. epi 1:200K + Xylocaine 40 mg  2nd dose, through catheter, after waiting 3 minutes...Marland Kitchen.Marland Kitchen.epi 1:200K + Xylocaine 60 mg    ( 2% Xylo charted as a single dose in Epic Meds for ease of charting; actual dosing was fractionated as above, for saftey's sake)  As each dose occurred, patient was free of IV sx; and patient exhibited no evidence of SA injection.  Patient is more comfortable after epidural dosed. Please see RN's note for documentation of vital signs,and FHR which are stable.  Patient reminded not to try to ambulate with numb legs, and that an RN must be present when she attempts to get up.      Epidural Patient location during procedure: OB  Preanesthetic Checklist Completed: patient identified, site marked, surgical consent, pre-op evaluation, timeout performed, IV checked, risks and benefits discussed and monitors and equipment checked  Epidural Patient position: sitting Prep: site prepped and draped and DuraPrep Patient monitoring: continuous pulse ox and blood pressure Approach:  midline Injection technique: LOR air  Needle:  Needle type: Tuohy  Needle gauge: 17 G Needle length: 9 cm and 9 Needle insertion depth: 5 cm cm Catheter type: closed end flexible Catheter size: 19 Gauge Catheter at skin depth: 10 cm Test dose: negative  Assessment Events: blood not aspirated, injection not painful, no injection resistance, negative IV test and no paresthesia  Additional Notes Dosing of Epidural:  1st dose, through catheter ............................................Marland Kitchen. epi 1:200K + Xylocaine 40 mg  2nd dose, through catheter, after waiting 3 minutes...Marland Kitchen.Marland Kitchen.epi 1:200K + Xylocaine 60 mg    ( 2% Xylo charted as a single dose in Epic Meds for ease of charting; actual dosing was fractionated as above, for saftey's sake)  As each dose occurred, patient was free of IV sx; and patient exhibited no evidence of SA injection.  Patient is more comfortable after epidural dosed. Please see RN's note for documentation of vital signs,and FHR which are stable.  Patient reminded not to try to ambulate with numb legs, and that an RN must be present when she attempts to get up.

## 2013-02-03 NOTE — Progress Notes (Signed)
UR chart review completed.  

## 2013-02-03 NOTE — Anesthesia Postprocedure Evaluation (Signed)
  Anesthesia Post-op Note  Patient: Laura Santos  Procedure(s) Performed: * No procedures listed *  Patient Location: Mother/Baby  Anesthesia Type:General  Level of Consciousness: awake, oriented and patient cooperative  Airway and Oxygen Therapy: Patient Spontanous Breathing  Post-op Pain: none  Post-op Assessment: Patient's Cardiovascular Status Stable, Respiratory Function Stable, Patent Airway, No signs of Nausea or vomiting, Adequate PO intake, Pain level controlled, No headache, No backache, No residual numbness and No residual motor weakness  Post-op Vital Signs: Reviewed and stable  Complications: No apparent anesthesia complications

## 2013-02-04 NOTE — Progress Notes (Signed)
Post Partum Day 1 Subjective: no complaints, up ad lib, voiding, tolerating PO and + flatus  Objective: Blood pressure 107/77, pulse 88, temperature 97.1 F (36.2 C), temperature source Oral, resp. rate 18, height 5\' 7"  (1.702 m), weight 88.451 kg (195 lb), SpO2 100.00%, unknown if currently breastfeeding.  Physical Exam:  General: alert, cooperative and no distress Lochia: appropriate Uterine Fundus: firm Incision: na DVT Evaluation: No evidence of DVT seen on physical exam. No cords or calf tenderness. No significant calf/ankle edema.   Recent Labs  02/02/13 2245  HGB 11.6*  HCT 35.4*    Assessment/Plan: Plan for discharge tomorrow and Contraception nexplanon Staying till tomorrow b/c of baby and GBS positive status   LOS: 2 days   Mehmet Scally L 02/04/2013, 7:47 AM

## 2013-02-05 NOTE — Discharge Summary (Signed)
Obstetric Discharge Summary Reason for Admission: onset of labor Prenatal Procedures: none Intrapartum Procedures: spontaneous vaginal delivery and GBS prophylaxis Postpartum Procedures: none Complications-Operative and Postpartum: labial laceration, Inadequate GBS tx  Hemoglobin  Date Value Range Status  02/02/2013 11.6* 12.0 - 15.0 g/dL Final     HCT  Date Value Range Status  02/02/2013 35.4* 36.0 - 46.0 % Final    Physical Exam:  General: alert, cooperative and appears stated age 1Lochia: appropriate Uterine Fundus: firm Incision: N/A DVT Evaluation: No evidence of DVT seen on physical exam.  Discharge Diagnoses: Term Pregnancy-delivered  Discharge Information: Date: 02/05/2013 Activity: pelvic rest Diet: routine Medications: None Condition: stable Instructions: refer to practice specific booklet Discharge to: home   Newborn Data: Live born female  Birth Weight: 7 lb 10 oz (3459 g) APGAR: 9, 9  Home with mother pending clearance from pediatric team for inadequate GBS coverage during delivery.   Twana FirstBryan R. Hess, DO [facility] 02/05/2013, 7:54 AM  I was present for the exam and agree with above.  [place] Latravious Levitt, CNM 02/05/2013 7:56 AM

## 2013-02-05 NOTE — Discharge Instructions (Signed)

## 2013-02-06 NOTE — Discharge Summary (Signed)
Attestation of Attending Supervision of Advanced Practitioner (CNM/NP): Evaluation and management procedures were performed by the Advanced Practitioner under my supervision and collaboration.  I have reviewed the Advanced Practitioner's note and chart, and I agree with the management and plan.  Aracely Rickett 02/06/2013 10:31 AM   

## 2013-02-28 ENCOUNTER — Encounter: Payer: Self-pay | Admitting: Nurse Practitioner

## 2013-03-06 ENCOUNTER — Ambulatory Visit: Payer: Medicaid Other | Admitting: Advanced Practice Midwife

## 2013-03-14 ENCOUNTER — Ambulatory Visit: Payer: Medicaid Other | Admitting: Nurse Practitioner

## 2013-03-20 ENCOUNTER — Encounter: Payer: Self-pay | Admitting: Medical

## 2013-03-20 ENCOUNTER — Ambulatory Visit (INDEPENDENT_AMBULATORY_CARE_PROVIDER_SITE_OTHER): Payer: Medicaid Other | Admitting: Medical

## 2013-03-20 VITALS — BP 126/88 | HR 67 | Ht 67.0 in | Wt 179.6 lb

## 2013-03-20 DIAGNOSIS — Z01812 Encounter for preprocedural laboratory examination: Secondary | ICD-10-CM

## 2013-03-20 DIAGNOSIS — Z30430 Encounter for insertion of intrauterine contraceptive device: Secondary | ICD-10-CM

## 2013-03-20 DIAGNOSIS — Z3049 Encounter for surveillance of other contraceptives: Secondary | ICD-10-CM

## 2013-03-20 MED ORDER — LEVONORGESTREL 20 MCG/24HR IU IUD
INTRAUTERINE_SYSTEM | Freq: Once | INTRAUTERINE | Status: AC
  Administered 2013-03-20: 13:00:00 via INTRAUTERINE

## 2013-03-20 NOTE — Progress Notes (Signed)
Patient undecided on birth control either wants nexplanon or depo.

## 2013-03-20 NOTE — Patient Instructions (Signed)
Intrauterine Device Insertion, Care After  Refer to this sheet in the next few weeks. These instructions provide you with information on caring for yourself after your procedure. Your health care provider may also give you more specific instructions. Your treatment has been planned according to current medical practices, but problems sometimes occur. Call your health care provider if you have any problems or questions after your procedure.  WHAT TO EXPECT AFTER THE PROCEDURE  Insertion of the IUD may cause some discomfort, such as cramping. The cramping should improve after the IUD is in place. You may have bleeding after the procedure. This is normal. It varies from light spotting for a few days to menstrual-like bleeding. When the IUD is in place, a string will extend past the cervix into the vagina for 1 2 inches. The strings should not bother you or your partner. If they do, talk to your health care provider.   HOME CARE INSTRUCTIONS    Check your intrauterine device (IUD) to make sure it is in place before you resume sexual activity. You should be able to feel the strings. If you cannot feel the strings, something may be wrong. The IUD may have fallen out of the uterus, or the uterus may have been punctured (perforated) during placement. Also, if the strings are getting longer, it may mean that the IUD is being forced out of the uterus. You no longer have full protection from pregnancy if any of these problems occur.   You may resume sexual intercourse if you are not having problems with the IUD. The copper IUD is considered immediately effective, and the hormone IUD works right away if inserted within 7 days of your period starting. You will need to use a backup method of birth control for 7 days if the IUD in inserted at any other time in your cycle.   Continue to check that the IUD is still in place by feeling for the strings after every menstrual period.   You may need to take pain medicine such as  acetaminophen or ibuprofen. Only take medicines as directed by your health care provider.  SEEK MEDICAL CARE IF:    You have bleeding that is heavier or lasts longer than a normal menstrual cycle.   You have a fever.   You have increasing cramps or abdominal pain not relieved with medicine.   You have abdominal pain that does not seem to be related to the same area of earlier cramping and pain.   You are lightheaded, unusually weak, or faint.   You have abnormal vaginal discharge or smells.   You have pain during sexual intercourse.   You cannot feel the IUD strings, or the IUD string has gotten longer.   You feel the IUD at the opening of the cervix in the vagina.   You think you are pregnant, or you miss your menstrual period.   The IUD string is hurting your sex partner.  MAKE SURE YOU:   Understand these instructions.   Will watch your condition.   Will get help right away if you are not doing well or get worse.  Document Released: 09/14/2010 Document Revised: 11/06/2012 Document Reviewed: 07/07/2012  [employer] Patient Information 2014 [employer]

## 2013-03-20 NOTE — Progress Notes (Signed)
Patient ID: Bretta BangDesiree Walter, female   DOB: 04-11-1990, 23 y.o.   MRN: [id number] Subjective:     Bretta BangDesiree Murrillo is a 23 y.o. female who presents for a postpartum visit. She is 6 weeks postpartum following a spontaneous vaginal delivery. I have fully reviewed the prenatal and intrapartum course. The delivery was at 40.4 gestational weeks. Outcome: spontaneous vaginal delivery. Anesthesia: epidural. Postpartum course has been normal. Baby's course has been normal. Baby is feeding by bottle - Gerber Gentle. Bleeding moderate red-brown blood. Bowel function is normal. Bladder function is normal. Patient is sexually active. Contraception method is condoms. Postpartum depression screening: negative.  The following portions of the patient's history were reviewed and updated as appropriate: allergies, current medications, past family history, past medical history, past social history, past surgical history and problem list.  Review of Systems Pertinent items are noted in HPI.   Objective:    BP 126/88  Pulse 67  Ht 5\' 7"  (1.702 m)  Wt 179 lb 9.6 oz (81.466 kg)  BMI 28.12 kg/m2  Breastfeeding? No  General:  alert and cooperative   Breasts:  not performed  Lungs: clear to auscultation bilaterally  Heart:  regular rate and rhythm, S1, S2 normal, no murmur, click, rub or gallop  Abdomen: soft, non-tender; bowel sounds normal; no masses,  no organomegaly   Vulva:  positive for condyloma  Vagina: normal vagina and scant blood noted  Cervix:  multiparous appearance, no cervical motion tenderness and no lesions  Corpus: normal size, contour, position, consistency, mobility, non-tender  Adnexa:  not evaluated  Rectal Exam: Not performed.          GYNECOLOGY CLINIC PROCEDURE NOTE  Bretta BangDesiree Jenkin is a 23 y.o. [phone] here for Mirena IUD insertion. No GYN concerns.  Last pap smear was on 03/2012 and was abnormal.  IUD Insertion Procedure Note Patient identified, informed consent performed.  Discussed  risks of irregular bleeding, cramping, infection, malpositioning or misplacement of the IUD outside the uterus which may require further procedures. Time out was performed.  Urine pregnancy test negative.  Speculum placed in the vagina.  Cervix visualized.  Cleaned with Betadine x 2.  Grasped anteriorly with a single tooth tenaculum.  Uterus sounded to 8 cm.  Mirena IUD placed per manufacturer's recommendations.  Strings trimmed to 3 cm. Tenaculum was removed, good hemostasis noted.  Patient tolerated procedure well.   Patient was given post-procedure instructions. Patient will return for follow up in 4 weeks for IUD check.   Assessment:     Normal postpartum exam. Pap smear not done at today's visit.   Plan:    1. Contraception: IUD 2. Patient will need pap smear at next visit 3. Follow up in: 4 weeks for string check and pap smear or as needed.

## 2013-04-21 ENCOUNTER — Ambulatory Visit: Payer: Medicaid Other | Admitting: Family Medicine

## 2013-04-21 ENCOUNTER — Telehealth: Payer: Self-pay | Admitting: *Deleted

## 2013-04-21 ENCOUNTER — Encounter: Payer: Self-pay | Admitting: *Deleted

## 2013-04-21 NOTE — Telephone Encounter (Signed)
Called patient, spoke with female who states she is unavailable.  Asked to leave message concerning missed appointment for 04/21/2013, if need to be seen call [phone].

## 2013-08-29 ENCOUNTER — Encounter: Payer: Self-pay | Admitting: *Deleted

## 2013-12-01 ENCOUNTER — Encounter: Payer: Self-pay | Admitting: Medical

## 2014-07-01 ENCOUNTER — Encounter: Payer: Medicaid Other | Admitting: Obstetrics & Gynecology

## 2014-08-10 ENCOUNTER — Encounter: Payer: Self-pay | Admitting: Family Medicine

## 2014-08-10 ENCOUNTER — Ambulatory Visit (INDEPENDENT_AMBULATORY_CARE_PROVIDER_SITE_OTHER): Payer: Medicaid Other | Admitting: Family Medicine

## 2014-08-10 VITALS — BP 115/71 | HR 80 | Wt 150.0 lb

## 2014-08-10 DIAGNOSIS — A630 Anogenital (venereal) warts: Secondary | ICD-10-CM | POA: Diagnosis not present

## 2014-08-10 NOTE — Patient Instructions (Signed)
Genital Warts Genital warts are a sexually transmitted infection. They may appear as small bumps on the tissues of the genital area. CAUSES  Genital warts are caused by a virus called human papillomavirus (HPV). HPV is the most common sexually transmitted disease (STD) and infection of the sex organs. This infection is spread by having unprotected sex with an infected person. It can be spread by vaginal, anal, and oral sex. Many people do not know they are infected. They may be infected for years without problems. However, even if they do not have problems, they can unknowingly pass the infection to their sexual partners. SYMPTOMS   Itching and irritation in the genital area.  Warts that bleed.  Painful sexual intercourse. DIAGNOSIS  Warts are usually recognized with the naked eye on the vagina, vulva, perineum, anus, and rectum. Certain tests can also diagnose genital warts, such as:  A Pap test.  A tissue sample (biopsy) exam.  Colposcopy. A magnifying tool is used to examine the vagina and cervix. The HPV cells will change color when certain solutions are used. TREATMENT  Warts can be removed by:  Applying certain chemicals, such as cantharidin or podophyllin.  Liquid nitrogen freezing (cryotherapy).  Immunotherapy with Candida or Trichophyton injections.  Laser treatment.  Burning with an electrified probe (electrocautery).  Interferon injections.  Surgery. PREVENTION  HPV vaccination can help prevent HPV infections that cause genital warts and that cause cancer of the cervix. It is recommended that the vaccination be given to people between the ages 9 to 26 years old. The vaccine might not work as well or might not work at all if you already have HPV. It should not be given to pregnant women. HOME CARE INSTRUCTIONS   It is important to follow your caregiver's instructions. The warts will not go away without treatment. Repeat treatments are often needed to get rid of warts.  Even after it appears that the warts are gone, the normal tissue underneath often remains infected.  Do not try to treat genital warts with medicine used to treat hand warts. This type of medicine is strong and can burn the skin in the genital area, causing more damage.  Tell your past and current sexual partner(s) that you have genital warts. They may be infected also and need treatment.  Avoid sexual contact while being treated.  Do not touch or scratch the warts. The infection may spread to other parts of your body.  Women with genital warts should have a cervical cancer check (Pap test) at least once a year. This type of cancer is slow-growing and can be cured if found early. Chances of developing cervical cancer are increased with HPV.  Inform your obstetrician about your warts in the event of pregnancy. This virus can be passed to the baby's respiratory tract. Discuss this with your caregiver.  Use a condom during sexual intercourse. Following treatment, the use of condoms will help prevent reinfection.  Ask your caregiver about using over-the-counter anti-itch creams. SEEK MEDICAL CARE IF:   Your treated skin becomes red, swollen, or painful.  You have a fever.  You feel generally ill.  You feel little lumps in and around your genital area.  You are bleeding or have painful sexual intercourse. MAKE SURE YOU:   Understand these instructions.  Will watch your condition.  Will get help right away if you are not doing well or get worse. Document Released: 01/14/2000 Document Revised: 06/02/2013 Document Reviewed: 07/25/2010 [employer] Patient Information 2015 [employer]. This   information is not intended to replace advice given to you by your health care provider. Make sure you discuss any questions you have with your health care provider.  

## 2014-08-10 NOTE — Progress Notes (Signed)
    Subjective:    Patient ID: Laura Santos is a 24 y.o. female presenting with gential warts  on 08/10/2014  HPI: Here for genital warts.  Initially worse with pregnancy.  Has used Aldara. Then with subsequent pregnancy they have gotten bigger. She has taken a pill as well and they seem to be getting bigger. Recent STD screen was negative.  Review of Systems  Constitutional: Negative for fever and chills.  Respiratory: Negative for shortness of breath.   Cardiovascular: Negative for chest pain.  Gastrointestinal: Negative for nausea, vomiting and abdominal pain.  Genitourinary: Negative for dysuria.  Skin: Negative for rash.      Objective:    BP 115/71 mmHg  Pulse 80  Wt 150 lb (68.04 kg)  LMP 07/22/2014  Breastfeeding? No Physical Exam  Constitutional: She is oriented to person, place, and time. She appears well-developed and well-nourished. No distress.  HENT:  Head: Normocephalic and atraumatic.  Eyes: No scleral icterus.  Neck: Neck supple.  Cardiovascular: Normal rate.   Pulmonary/Chest: Effort normal.  Abdominal: Soft.  Genitourinary:  Multiple warts noted bilaterally from the clitoris to the rectum. Some are quite large up to 1 cm in size.  Neurological: She is alert and oriented to person, place, and time.  Skin: Skin is warm and dry.  Psychiatric: She has a normal mood and affect.    Procedure:   Areas of typical appearing genital warts noted as above.  Surrounding area coated with water based lubricant and TCA applied until warts had white appearance.   Patient tolerated the procedure well.    Post procedure instructions given and patient told to wash area in thirty minutes.  Return in 2 week for next treatment.     Assessment & Plan:   Problem List Items Addressed This Visit      Unprioritized   Genital warts - Primary    S/p TCA--continue treatment.         Return in 2 week for next treatment.  Ghazal Pevey S 08/10/2014 1:28 PM

## 2014-08-10 NOTE — Assessment & Plan Note (Signed)
S/p TCA--continue treatment.

## 2014-08-17 ENCOUNTER — Encounter: Payer: Self-pay | Admitting: Obstetrics and Gynecology

## 2014-08-17 ENCOUNTER — Ambulatory Visit (INDEPENDENT_AMBULATORY_CARE_PROVIDER_SITE_OTHER): Payer: Medicaid Other | Admitting: Obstetrics and Gynecology

## 2014-08-17 VITALS — BP 118/73 | HR 80 | Resp 16 | Ht 67.0 in | Wt 151.0 lb

## 2014-08-17 DIAGNOSIS — A630 Anogenital (venereal) warts: Secondary | ICD-10-CM | POA: Diagnosis not present

## 2014-08-17 NOTE — Progress Notes (Signed)
Patient ID: Laura Santos, female   DOB: Jun 29, 1990, 24 y.o.   MRN: [id number] 24 yo with genital warts here for follow up and TCA treatment.  Pelvic: multiple genital warts ranging in size from 3 mm to 1 cm involving clitoral hood, labial majora and perirectal regions  Procedure:   Areas of typical appearing genital warts noted as above. Surrounding area coated with water based lubricant and TCA applied until warts had white appearance.   Patient tolerated the procedure well.   Post procedure instructions given and patient told to wash area in thirty minutes.  RTC in 2 weeks for further treatment

## 2014-08-21 ENCOUNTER — Encounter (HOSPITAL_COMMUNITY): Payer: Self-pay | Admitting: Emergency Medicine

## 2014-08-21 ENCOUNTER — Emergency Department (HOSPITAL_COMMUNITY)
Admission: EM | Admit: 2014-08-21 | Discharge: 2014-08-21 | Disposition: A | Discharge status: Home / Self Care | Payer: Medicaid Other | Attending: Emergency Medicine | Admitting: Emergency Medicine

## 2014-08-21 DIAGNOSIS — Z3202 Encounter for pregnancy test, result negative: Secondary | ICD-10-CM | POA: Insufficient documentation

## 2014-08-21 DIAGNOSIS — Z720 Tobacco use: Secondary | ICD-10-CM | POA: Diagnosis not present

## 2014-08-21 DIAGNOSIS — Z8619 Personal history of other infectious and parasitic diseases: Secondary | ICD-10-CM | POA: Diagnosis not present

## 2014-08-21 LAB — I-STAT BETA HCG BLOOD, ED (MC, WL, AP ONLY): I-stat hCG, quantitative: <5 m[IU]/mL (ref <5)

## 2014-08-21 NOTE — Discharge Instructions (Signed)
Read the information below.  You may return to the Emergency Department at any time for worsening condition or any new symptoms that concern you. If you still have not had your period in 1 week please retake a home pregnancy test.    Pregnancy Tests HOW DO PREGNANCY TESTS WORK? All pregnancy tests look for a special hormone in the urine or blood that is only present in pregnant women. This hormone, human chorionic gonadotropin (hCG), is also called the pregnancy hormone.  WHAT IS THE DIFFERENCE BETWEEN A URINE AND A BLOOD PREGNANCY TEST? IS ONE BETTER THAN THE OTHER? There are two types of pregnancy tests.  Blood tests.  Urine tests. Both tests look for the presence of hCG, the pregnancy hormone. Many women use a urine test or home pregnancy test (HPT) to find out if they are pregnant. HPTs are cheap, easy to use, can be done at home, and are private. When a woman has a positive result on an HPT, she needs to see her caregiver right away. The caregiver can confirm a positive HPT result with another urine test, a blood test, ultrasound, and a pelvic exam.  There are two types of blood tests you can get from a caregiver.   A quantitative blood test (or the beta hCG test). This test measures the exact amount of hCG in the blood. This means it can pick up very small amounts of hCG, making it a very accurate test.  A qualitative hCG blood test. This test gives a simple yes or no answer to whether you are pregnant. This test is more like a urine test in terms of its accuracy. Blood tests can pick up hCG earlier in a pregnancy than urine tests can. Blood tests can tell if you are pregnant about 6 to 8 days after you release an egg from an ovary (ovulate). Urine tests can determine pregnancy about 2 weeks after ovulation.  HOW IS A HOME PREGNANCY TEST DONE?  There are many types of home pregnancy tests or HPTs that can be bought over-the-counter at drug or discount stores.   Some involve collecting  your urine in a cup and dipping a stick into the urine or putting some of the urine into a special container with an eyedropper.  Others are done by placing a stick into your urine stream.  Tests vary in how long you need to wait for the stick or container to turn a certain color or have a symbol on it (like a plus or a minus).  All tests come with written instructions. Most tests also have toll-free phone numbers to call if you have any questions about how to do the test or read the results. HOW ACCURATE ARE HOME PREGNANCY TESTS?  HPTs are very accurate. Most brands of HPTs say they are 97% to 99% accurate when taken 1 week after missing your menstrual period, but this can vary with actual use. Each brand varies in how sensitive it is in picking up the pregnancy hormone hCG. If a test is not done correctly, it will be less accurate. Always check the package to make sure it is not past its expiration date. If it is, it will not be accurate. Most brands of HPTs tell users to do the test again in a few days, no matter what the results.  If you use an HPT too early in your pregnancy, you may not have enough of the pregnancy hormone hCG in your urine to have a positive test  result. Most HPTs will be accurate if you test yourself around the time your period is due (about 2 weeks after you ovulate). You can get a negative test result if you are not pregnant or if you ovulated later than you thought you did. You may also have problems with the pregnancy, which affects the amount of hCG you have in your urine. If your HPT is negative, test yourself again within a few days to 1 week. If you keep getting a negative result and think you are pregnant, talk with your caregiver right away about getting a blood pregnancy test.  FALSE POSITIVE PREGNANCY TEST A false positive HPT can happen if there is blood or protein present in your urine. A false positive can also happen if you were recently pregnant or if you take a  pregnancy test too soon after taking fertility drug that contains hCG. Also, some prescription medicines such as water pills (diuretics), tranquilizers, seizure medicines, psychiatric medicines, and allergy and nausea medicines (promethazine) give false positive readings. FALSE NEGATIVE PREGNANCY TEST  A false negative HPT can happen if you do the test too early. Try to wait until you are at least 1 day late for your menstrual period.  It may happen if you wait too long to test the urine (longer than 15 minutes).  It may also happen if the urine is too diluted because you drank a lot of fluids before getting the urine sample. It is best to test the first morning urine after you get out of bed. If your menstrual period did not start after a week of a negative HPT, repeat the pregnancy test. CAN ANYTHING INTERFERE WITH HOME PREGNANCY TEST RESULTS?  Most medicines, both over-the-counter and prescription drugs, including birth control pills and antibiotics, should not affect the results of a HPT. Only those drugs that have the pregnancy hormone hCG in them can give a false positive test result. Drugs that have hCG in them may be used for treating infertility (not being able to get pregnant). Alcohol and illegal drugs do not affect HPT results, but you should not be using these substances if you are trying to get pregnant. If you have a positive pregnancy test, call your caregiver to make an appointment to begin prenatal care. Document Released: 01/19/2003 Document Revised: 04/10/2011 Document Reviewed: 05/02/2013 [facility] Patient Information 2015 [place], [place]. This information is not intended to replace advice given to you by your health care provider. Make sure you discuss any questions you have with your health care provider.

## 2014-08-21 NOTE — ED Notes (Signed)
Pt reports that she needs to know if she is pregnant due to her HPV treatment. Pt reports that she is 1 day late for her menstrual cycle. PT reports taking 3 pregnancy tests at home which were all negative. Pt denies any symptoms.

## 2014-08-21 NOTE — ED Provider Notes (Signed)
CSN: [id number]     Arrival date & time 08/21/14  0909 History   First MD Initiated Contact with Patient 08/21/14 [phone]     Chief Complaint  Patient presents with  . Possible Pregnancy     (Consider location/radiation/quality/duration/timing/severity/associated sxs/prior Treatment) The history is provided by the patient.     Pt with LMP 07/21/14 requesting pregnancy test.  States she is getting weekly treatments for HPV and needs to know if she is pregnant because it will change her treatment options.  She did have unprotected sex once this month.  Has had multiple negative pregnancy tests at home.  Menstrual period is one day late.  Denies abdominal pain, N/V, abnormal vaginal discharge or bleeding.   Past Medical History  Diagnosis Date  . HPV-genital warts   . Abnormal Pap smear   . History of chlamydia   . History of gonorrhea    Past Surgical History  Procedure Laterality Date  . Wisdom tooth extraction     Family History  Problem Relation Age of Onset  . Hypertension Mother   . Diabetes Maternal Aunt   . Hypertension Maternal Aunt   . Diabetes Maternal Grandmother   . Hypertension Maternal Grandmother   . Diabetes Cousin    History  Substance Use Topics  . Smoking status: Current Every Day Smoker -- 0.50 packs/day for 4 years    Types: Cigarettes    Last Attempt to Quit: 08/16/2010  . Smokeless tobacco: Never Used  . Alcohol Use: 0.0 oz/week    0 Standard drinks or equivalent per week     Comment: Occasional   OB History    Gravida Para Term Preterm AB TAB SAB Ectopic Multiple Living   2 2 2  0 0 0 0 0 0 2     Review of Systems  Constitutional: Negative for fever.  Respiratory: Negative for shortness of breath.   Gastrointestinal: Negative for nausea, vomiting and abdominal pain.  Genitourinary: Positive for menstrual problem. Negative for vaginal bleeding, vaginal discharge and vaginal pain.  Musculoskeletal: Negative for myalgias.  Allergic/Immunologic:  Negative for immunocompromised state.  Psychiatric/Behavioral: Negative for self-injury.      Allergies  Review of patient's allergies indicates no known allergies.  Home Medications   Prior to Admission medications   Not on File   BP 111/71 mmHg  Pulse 89  Temp(Src) 98.1 F (36.7 C) (Oral)  Resp 16  SpO2 99%  LMP 07/22/2014 (Exact Date) Physical Exam  Constitutional: She appears well-developed and well-nourished. No distress.  HENT:  Head: Normocephalic and atraumatic.  Neck: Neck supple.  Pulmonary/Chest: Effort normal.  Neurological: She is alert.  Skin: She is not diaphoretic.  Nursing note and vitals reviewed.   ED Course  Procedures (including critical care time) Labs Review Labs Reviewed  I-STAT BETA HCG BLOOD, ED ([facility], [facility], [facility] ONLY)    Imaging Review No results found.   EKG Interpretation None      MDM   Final diagnoses:  Negative pregnancy test    Afebrile, nontoxic patient with request for pregnancy test.  She is asymptomatic.  Test is negative.  Advised to repeat home test in one week if period does not start.  She is currently only 1 day late for her period.    D/C home.   Discussed result, findings, treatment, and follow up  with patient.  Pt given return precautions.  Pt verbalizes understanding and agrees with plan.         Trixie Dredge, PA-C  08/21/14 1612  Doug Sou, MD 08/21/14 1616

## 2014-08-31 ENCOUNTER — Ambulatory Visit (INDEPENDENT_AMBULATORY_CARE_PROVIDER_SITE_OTHER): Payer: Medicaid Other | Admitting: Obstetrics & Gynecology

## 2014-08-31 ENCOUNTER — Encounter: Payer: Self-pay | Admitting: Obstetrics & Gynecology

## 2014-08-31 VITALS — BP 103/66 | HR 62 | Ht 67.0 in | Wt 155.0 lb

## 2014-08-31 DIAGNOSIS — A630 Anogenital (venereal) warts: Secondary | ICD-10-CM

## 2014-08-31 NOTE — Patient Instructions (Signed)
Genital Warts Genital warts are a sexually transmitted infection. They may appear as small bumps on the tissues of the genital area. CAUSES  Genital warts are caused by a virus called human papillomavirus (HPV). HPV is the most common sexually transmitted disease (STD) and infection of the sex organs. This infection is spread by having unprotected sex with an infected person. It can be spread by vaginal, anal, and oral sex. Many people do not know they are infected. They may be infected for years without problems. However, even if they do not have problems, they can unknowingly pass the infection to their sexual partners. SYMPTOMS   Itching and irritation in the genital area.  Warts that bleed.  Painful sexual intercourse. DIAGNOSIS  Warts are usually recognized with the naked eye on the vagina, vulva, perineum, anus, and rectum. Certain tests can also diagnose genital warts, such as:  A Pap test.  A tissue sample (biopsy) exam.  Colposcopy. A magnifying tool is used to examine the vagina and cervix. The HPV cells will change color when certain solutions are used. TREATMENT  Warts can be removed by:  Applying certain chemicals, such as cantharidin or podophyllin.  Liquid nitrogen freezing (cryotherapy).  Immunotherapy with Candida or Trichophyton injections.  Laser treatment.  Burning with an electrified probe (electrocautery).  Interferon injections.  Surgery. PREVENTION  HPV vaccination can help prevent HPV infections that cause genital warts and that cause cancer of the cervix. It is recommended that the vaccination be given to people between the ages 9 to 26 years old. The vaccine might not work as well or might not work at all if you already have HPV. It should not be given to pregnant women. HOME CARE INSTRUCTIONS   It is important to follow your caregiver's instructions. The warts will not go away without treatment. Repeat treatments are often needed to get rid of warts.  Even after it appears that the warts are gone, the normal tissue underneath often remains infected.  Do not try to treat genital warts with medicine used to treat hand warts. This type of medicine is strong and can burn the skin in the genital area, causing more damage.  Tell your past and current sexual partner(s) that you have genital warts. They may be infected also and need treatment.  Avoid sexual contact while being treated.  Do not touch or scratch the warts. The infection may spread to other parts of your body.  Women with genital warts should have a cervical cancer check (Pap test) at least once a year. This type of cancer is slow-growing and can be cured if found early. Chances of developing cervical cancer are increased with HPV.  Inform your obstetrician about your warts in the event of pregnancy. This virus can be passed to the baby's respiratory tract. Discuss this with your caregiver.  Use a condom during sexual intercourse. Following treatment, the use of condoms will help prevent reinfection.  Ask your caregiver about using over-the-counter anti-itch creams. SEEK MEDICAL CARE IF:   Your treated skin becomes red, swollen, or painful.  You have a fever.  You feel generally ill.  You feel little lumps in and around your genital area.  You are bleeding or have painful sexual intercourse. MAKE SURE YOU:   Understand these instructions.  Will watch your condition.  Will get help right away if you are not doing well or get worse. Document Released: 01/14/2000 Document Revised: 06/02/2013 Document Reviewed: 07/25/2010 [employer] Patient Information 2015 [employer]. This   information is not intended to replace advice given to you by your health care provider. Make sure you discuss any questions you have with your health care provider.  

## 2014-08-31 NOTE — Progress Notes (Signed)
Patient ID: Laura Santos, female   DOB: [birth year], 24 y.o.   MRN: [id number] History:  24 y.o. [id number] here today for treatment of vulvar condyloma.  Pt wants to know if she can have lazor or other treatment. She reports that when she was referred from the [facility] they suggested more than TCA.  The following portions of the patient's history were reviewed and updated as appropriate: allergies, current medications, past family history, past medical history, past social history, past surgical history and problem list.  Review of Systems:  Pertinent items are noted in HPI.  Objective:  Physical Exam Blood pressure 103/66, pulse 62, height  (1.702 m), weight 155 lb (70.308 kg), last menstrual period 08/21/2014, not currently breastfeeding. 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  Assessment & Plan:  Patient desires surgical management with resection of extensive condyloma accuminata.  The risks of surgery were discussed in detail with the patient including but not limited to: bleeding which may require transfusion or reoperation; infection which may require prolonged hospitalization or re-hospitalization and antibiotic therapy; injury to bowel, bladder, ureters and major vessels or other surrounding organs; need for additional procedures including laparotomy; thromboembolic phenomenon, incisional problems and other postoperative or anesthesia complications.  Patient was told that the likelihood that her condition and symptoms will be treated effectively with this surgical management was very good; the postoperative expectations were also discussed in detail. The patient also understands the alternative treatment options which were discussed in full. All questions were answered.  She was told that she will be contacted by our surgical scheduler regarding the time and date of her surgery; routine preoperative instructions of having nothing  to eat or drink after midnight on the day prior to surgery and also coming to the hospital 1 1/2 hours prior to her time of surgery were also emphasized.  She was told she may be called for a preoperative appointment about a week prior to surgery and will be given further preoperative instructions at that visit. Printed patient education handouts about the procedure were given to the patient to review at home.  Jaileen Janelle L. Harraway-Smith, M.D., [facility]

## 2014-09-09 ENCOUNTER — Encounter (HOSPITAL_COMMUNITY): Payer: Self-pay | Admitting: *Deleted

## 2014-10-06 ENCOUNTER — Ambulatory Visit (HOSPITAL_COMMUNITY): Payer: Medicaid Other | Admitting: Anesthesiology

## 2014-10-06 ENCOUNTER — Encounter (HOSPITAL_COMMUNITY): Payer: Self-pay | Admitting: *Deleted

## 2014-10-06 ENCOUNTER — Encounter (HOSPITAL_COMMUNITY)
Admission: RE | Disposition: A | Discharge status: Home / Self Care | Payer: Self-pay | Source: Ambulatory Visit | Attending: Obstetrics & Gynecology

## 2014-10-06 ENCOUNTER — Ambulatory Visit (HOSPITAL_COMMUNITY)
Admission: RE | Admit: 2014-10-06 | Discharge: 2014-10-06 | Disposition: A | Discharge status: Home / Self Care | Payer: Medicaid Other | Source: Ambulatory Visit | Attending: Obstetrics & Gynecology | Admitting: Obstetrics & Gynecology

## 2014-10-06 DIAGNOSIS — A630 Anogenital (venereal) warts: Secondary | ICD-10-CM | POA: Insufficient documentation

## 2014-10-06 DIAGNOSIS — F17210 Nicotine dependence, cigarettes, uncomplicated: Secondary | ICD-10-CM | POA: Insufficient documentation

## 2014-10-06 DIAGNOSIS — E669 Obesity, unspecified: Secondary | ICD-10-CM | POA: Diagnosis not present

## 2014-10-06 DIAGNOSIS — Z6824 Body mass index (BMI) 24.0-24.9, adult: Secondary | ICD-10-CM | POA: Insufficient documentation

## 2014-10-06 HISTORY — PX: CONDYLOMA EXCISION/FULGURATION: SHX1389

## 2014-10-06 LAB — PREGNANCY, URINE: Preg Test, Ur: NEGATIVE

## 2014-10-06 LAB — CBC
HCT: 37 % (ref 36.0–46.0)
Hemoglobin: 12 g/dL (ref 12.0–15.0)
MCH: 29.6 pg (ref 26.0–34.0)
MCHC: 32.4 g/dL (ref 30.0–36.0)
MCV: 91.4 fL (ref 78.0–100.0)
PLATELETS: 272 10*3/uL (ref 150–400)
RBC: 4.05 MIL/uL (ref 3.87–5.11)
RDW: 14 % (ref 11.5–15.5)
WBC: 8.9 10*3/uL (ref 4.0–10.5)

## 2014-10-06 SURGERY — REMOVAL, CONDYLOMA
Anesthesia: General | Site: Vagina

## 2014-10-06 MED ORDER — DEXAMETHASONE SODIUM PHOSPHATE 10 MG/ML IJ SOLN
INTRAMUSCULAR | Status: AC
  Filled 2014-10-06: qty 1

## 2014-10-06 MED ORDER — HYDROCODONE-ACETAMINOPHEN 7.5-325 MG PO TABS: 1.0000 | ORAL_TABLET | Freq: Once | ORAL | Status: DC | PRN

## 2014-10-06 MED ORDER — KETOROLAC TROMETHAMINE 30 MG/ML IJ SOLN
INTRAMUSCULAR | Status: DC | PRN
  Administered 2014-10-06: 30 mg via INTRAVENOUS

## 2014-10-06 MED ORDER — KETOROLAC TROMETHAMINE 30 MG/ML IJ SOLN
INTRAMUSCULAR | Status: AC
  Filled 2014-10-06: qty 1

## 2014-10-06 MED ORDER — SILVER SULFADIAZINE 1 % EX CREA: 1.0000 "application " | TOPICAL_CREAM | Freq: Every day | CUTANEOUS | Status: DC

## 2014-10-06 MED ORDER — LIDOCAINE HCL (CARDIAC) 20 MG/ML IV SOLN
INTRAVENOUS | Status: AC
  Filled 2014-10-06: qty 5

## 2014-10-06 MED ORDER — BUPIVACAINE HCL (PF) 0.5 % IJ SOLN
INTRAMUSCULAR | Status: AC
  Filled 2014-10-06: qty 30

## 2014-10-06 MED ORDER — DEXAMETHASONE SODIUM PHOSPHATE 10 MG/ML IJ SOLN
INTRAMUSCULAR | Status: DC | PRN
  Administered 2014-10-06: 10 mg via INTRAVENOUS

## 2014-10-06 MED ORDER — TRAMADOL HCL 50 MG PO TABS: 50.0000 mg | ORAL_TABLET | Freq: Four times a day (QID) | ORAL | Status: DC | PRN

## 2014-10-06 MED ORDER — LIDOCAINE HCL (CARDIAC) 20 MG/ML IV SOLN
INTRAVENOUS | Status: DC | PRN
  Administered 2014-10-06: 80 mg via INTRAVENOUS

## 2014-10-06 MED ORDER — SILVER SULFADIAZINE 1 % EX CREA
TOPICAL_CREAM | CUTANEOUS | Status: AC
  Filled 2014-10-06: qty 85

## 2014-10-06 MED ORDER — SILVER SULFADIAZINE 1 % EX CREA
TOPICAL_CREAM | CUTANEOUS | Status: DC | PRN
  Administered 2014-10-06: 1 via TOPICAL

## 2014-10-06 MED ORDER — FENTANYL CITRATE (PF) 100 MCG/2ML IJ SOLN
INTRAMUSCULAR | Status: AC
  Filled 2014-10-06: qty 2

## 2014-10-06 MED ORDER — FERRIC SUBSULFATE 259 MG/GM EX SOLN
CUTANEOUS | Status: AC
  Filled 2014-10-06: qty 8

## 2014-10-06 MED ORDER — MIDAZOLAM HCL 5 MG/5ML IJ SOLN
INTRAMUSCULAR | Status: DC | PRN
  Administered 2014-10-06: 2 mg via INTRAVENOUS

## 2014-10-06 MED ORDER — PROPOFOL 10 MG/ML IV BOLUS
INTRAVENOUS | Status: DC | PRN
  Administered 2014-10-06: 200 mg via INTRAVENOUS

## 2014-10-06 MED ORDER — ACETIC ACID 5 % SOLN
Status: AC
  Filled 2014-10-06: qty 500

## 2014-10-06 MED ORDER — FENTANYL CITRATE (PF) 250 MCG/5ML IJ SOLN
INTRAMUSCULAR | Status: AC
  Filled 2014-10-06: qty 25

## 2014-10-06 MED ORDER — MIDAZOLAM HCL 2 MG/2ML IJ SOLN
INTRAMUSCULAR | Status: AC
  Filled 2014-10-06: qty 4

## 2014-10-06 MED ORDER — PROPOFOL 10 MG/ML IV BOLUS
INTRAVENOUS | Status: AC
  Filled 2014-10-06: qty 20

## 2014-10-06 MED ORDER — METOCLOPRAMIDE HCL 5 MG/ML IJ SOLN: 10.0000 mg | Freq: Once | INTRAMUSCULAR | Status: DC | PRN

## 2014-10-06 MED ORDER — SCOPOLAMINE 1 MG/3DAYS TD PT72
MEDICATED_PATCH | TRANSDERMAL | Status: AC
  Administered 2014-10-06: 1.5 mg via TRANSDERMAL
  Filled 2014-10-06: qty 1

## 2014-10-06 MED ORDER — FENTANYL CITRATE (PF) 100 MCG/2ML IJ SOLN
25.0000 ug | INTRAMUSCULAR | Status: DC | PRN
  Administered 2014-10-06: 25 ug via INTRAVENOUS

## 2014-10-06 MED ORDER — LACTATED RINGERS IV SOLN
INTRAVENOUS | Status: DC
  Administered 2014-10-06 (×2): via INTRAVENOUS

## 2014-10-06 MED ORDER — ONDANSETRON HCL 4 MG/2ML IJ SOLN
INTRAMUSCULAR | Status: AC
  Filled 2014-10-06: qty 2

## 2014-10-06 MED ORDER — ONDANSETRON HCL 4 MG/2ML IJ SOLN
INTRAMUSCULAR | Status: DC | PRN
  Administered 2014-10-06: 4 mg via INTRAVENOUS

## 2014-10-06 MED ORDER — SCOPOLAMINE 1 MG/3DAYS TD PT72
1.0000 | MEDICATED_PATCH | Freq: Once | TRANSDERMAL | Status: DC
  Administered 2014-10-06: 1.5 mg via TRANSDERMAL

## 2014-10-06 MED ORDER — FENTANYL CITRATE (PF) 100 MCG/2ML IJ SOLN
INTRAMUSCULAR | Status: DC | PRN
  Administered 2014-10-06 (×5): 50 ug via INTRAVENOUS

## 2014-10-06 MED ORDER — MEPERIDINE HCL 25 MG/ML IJ SOLN: 6.2500 mg | INTRAMUSCULAR | Status: DC | PRN

## 2014-10-06 SURGICAL SUPPLY — 24 items
BLADE SURG 15 STRL LF C SS BP (BLADE) ×1 IMPLANT
BLADE SURG 15 STRL SS (BLADE) ×3
CLOTH BEACON ORANGE TIMEOUT ST (SAFETY) ×3 IMPLANT
CONTAINER PREFILL 10% NBF 15ML (MISCELLANEOUS) IMPLANT
COUNTER NEEDLE 1200 MAGNETIC (NEEDLE) IMPLANT
ELECT REM PT RETURN 9FT ADLT (ELECTROSURGICAL) ×3
ELECTRODE REM PT RTRN 9FT ADLT (ELECTROSURGICAL) ×1 IMPLANT
EVACUATOR PREFILTER SMOKE (MISCELLANEOUS) IMPLANT
GLOVE BIO SURGEON STRL SZ7 (GLOVE) ×3 IMPLANT
GLOVE INDICATOR 7.0 STRL GRN (GLOVE) ×3 IMPLANT
GOWN STRL REUS W/TWL LRG LVL3 (GOWN DISPOSABLE) ×3 IMPLANT
GOWN STRL REUS W/TWL XL LVL3 (GOWN DISPOSABLE) ×3 IMPLANT
HOSE NS SMOKE EVAC 7/8 X6 (MISCELLANEOUS) IMPLANT
HOSE NS SMOKE EVAC 7/8 X6' (MISCELLANEOUS)
NS IRRIG 1000ML POUR BTL (IV SOLUTION) ×3 IMPLANT
PACK VAGINAL MINOR WOMEN LF (CUSTOM PROCEDURE TRAY) ×3 IMPLANT
PAD OB MATERNITY 4.3X12.25 (PERSONAL CARE ITEMS) ×3 IMPLANT
PAD PREP 24X48 CUFFED NSTRL (MISCELLANEOUS) ×3 IMPLANT
PENCIL BUTTON HOLSTER BLD 10FT (ELECTRODE) ×3 IMPLANT
REDUCER FITTING SMOKE EVAC (MISCELLANEOUS) IMPLANT
SUT VIC AB 4-0 SH 27 (SUTURE)
SUT VIC AB 4-0 SH 27XANBCTRL (SUTURE) IMPLANT
TOWEL OR 17X24 6PK STRL BLUE (TOWEL DISPOSABLE) ×6 IMPLANT
WATER STERILE IRR 1000ML POUR (IV SOLUTION) ×3 IMPLANT

## 2014-10-06 NOTE — Anesthesia Preprocedure Evaluation (Signed)
Anesthesia Evaluation  Patient identified by MRN, date of birth, ID band Patient awake    Airway Mallampati: II  TM Distance: >3 FB Neck ROM: Full    Dental no notable dental hx. (+) Teeth Intact   Pulmonary Current Smoker,  breath sounds clear to auscultation  Pulmonary exam normal       Cardiovascular Normal cardiovascular examRhythm:Regular Rate:Normal     Neuro/Psych negative neurological ROS  negative psych ROS   GI/Hepatic negative GI ROS, Neg liver ROS,   Endo/Other  negative endocrine ROS  Renal/GU negative Renal ROS  negative genitourinary   Musculoskeletal   Abdominal (+) - obese,   Peds  Hematology negative hematology ROS (+)   Anesthesia Other Findings   Reproductive/Obstetrics HPV with genital warts                             Anesthesia Physical Anesthesia Plan  ASA: II  Anesthesia Plan: General   Post-op Pain Management:    Induction: Intravenous  Airway Management Planned: LMA  Additional Equipment:   Intra-op Plan:   Post-operative Plan: Extubation in OR  Informed Consent: I have reviewed the patients History and Physical, chart, labs and discussed the procedure including the risks, benefits and alternatives for the proposed anesthesia with the patient or authorized representative who has indicated his/her understanding and acceptance.   Dental advisory given  Plan Discussed with: CRNA, Anesthesiologist and Surgeon  Anesthesia Plan Comments:         Anesthesia Quick Evaluation

## 2014-10-06 NOTE — Op Note (Signed)
10/06/2014  1:22 PM  PATIENT:  Laura Santos  24 y.o. female  PRE-OPERATIVE DIAGNOSIS:  cpt 11420 - Extensive genital warts  POST-OPERATIVE DIAGNOSIS:  extensive genital warts  PROCEDURE:  Procedure(s) with comments: CONDYLOMA REMOVAL (N/A) - and condyloma removal in anal area  SURGEON:  Surgeon(s) and Role:    * Willodean Rosenthal, MD - Primary  ANESTHESIA:   general  EBL:  Total I/O In: 1300 [I.V.:1300] Out: 20 [Urine:20]  BLOOD ADMINISTERED:none  DRAINS: none   LOCAL MEDICATIONS USED:  NONE  SPECIMEN:  Source of Specimen:  genital warts   DISPOSITION OF SPECIMEN:  PATHOLOGY  COUNTS:  YES  TOURNIQUET:  * No tourniquets in log *  DICTATION: .Note written in EPIC  PLAN OF CARE: Discharge to home after PACU  PATIENT DISPOSITION:  PACU - hemodynamically stable.   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 She was counseled about that efficacy of this procedure, and possible need for subsequent treatment in the future. All her questions were answered, and written informed consent was obtained.  The patient was placed in the dorsal lithotomy position and the lesions were identified.  There were >10 lesions of varying sizes.  These were each grasped individually and using the needle tip cautery to the base of the lesion using a cut power of 90 and a coagulation power of 60 the lesions were individually removed.  There was minimal bleeding.  At the end of the procedure, Silvadene cream was applied.   The patient tolerated the procedure well without any complications. Routine post procedure instructions were given to the  patient.  She was prescribed Silvadene cream and Ultram.  She is to f/u in 2 weeks for a post op check.  Dessirae Scarola L. Harraway-Smith, M.D., [facility]

## 2014-10-06 NOTE — Anesthesia Postprocedure Evaluation (Signed)
  Anesthesia Post-op Note  Patient: Laura Santos  Procedure(s) Performed: Procedure(s) with comments: CONDYLOMA REMOVAL (N/A) - and condyloma removal in anal area  Patient Location: PACU  Anesthesia Type:General  Level of Consciousness: awake, alert  and oriented  Airway and Oxygen Therapy: Patient Spontanous Breathing  Post-op Pain: mild  Post-op Assessment: Post-op Vital signs reviewed and Patient's Cardiovascular Status Stable              Post-op Vital Signs: Reviewed and stable  Last Vitals:  Filed Vitals:   10/06/14 1530  BP: 100/65  Pulse: 57  Temp: 36.7 C  Resp: 16    Complications: No apparent anesthesia complications

## 2014-10-06 NOTE — Transfer of Care (Signed)
Immediate Anesthesia Transfer of Care Note  Patient: Laura Santos  Procedure(s) Performed: Procedure(s) with comments: CONDYLOMA REMOVAL (N/A) - and condyloma removal in anal area  Patient Location: PACU  Anesthesia Type:General  Level of Consciousness: awake and sedated  Airway & Oxygen Therapy: Patient Spontanous Breathing and Patient connected to nasal cannula oxygen  Post-op Assessment: Report given to RN and Post -op Vital signs reviewed and stable  Post vital signs: stable  Last Vitals:  Filed Vitals:   10/06/14 0854  BP: 123/67  Pulse: 67  Temp: 36.8 C  Resp: 18    Complications: No apparent anesthesia complications

## 2014-10-06 NOTE — H&P (Signed)
Preoperative History and Physical  Laura Santos is a 24 y.o. [phone] here for surgical management of vulvar and perianal condyloma.   Proposed surgery: resection of genital warts  Past Medical History  Diagnosis Date  . HPV-genital warts   . Abnormal Pap smear   . History of chlamydia   . History of gonorrhea   . Vaginal delivery 2012 2015   Past Surgical History  Procedure Laterality Date  . Wisdom tooth extraction     OB History    Gravida Para Term Preterm AB TAB SAB Ectopic Multiple Living   0 0 0 0 0 0 2     Patient denies any cervical dysplasia or STIs. No prescriptions prior to admission    No Known Allergies Social History:   reports that she has been smoking Cigarettes.  She has a 2 pack-year smoking history. She has never used smokeless tobacco. She reports that she drinks alcohol. She reports that she does not use illicit drugs. Family History  Problem Relation Age of Onset  . Hypertension Mother   . Diabetes Maternal Aunt   . Hypertension Maternal Aunt   . Diabetes Maternal Grandmother   . Hypertension Maternal Grandmother   . Diabetes Cousin     Review of Systems: Noncontributory  PHYSICAL EXAM: Blood pressure 123/67, pulse 67, temperature 98.2 F (36.8 C), temperature source Oral, resp. rate 18, height 5' 6.5" (1.689 m), weight 156 lb (70.761 kg), SpO2 100 %, not currently breastfeeding. General appearance - alert, well appearing, and in no distress Chest - clear to auscultation, no wheezes, rales or rhonchi, symmetric air entry Heart - normal rate and regular rhythm Abdomen - soft, nontender, nondistended, no masses or organomegaly Pelvic - examination not indicated Extremities - peripheral pulses normal, no pedal edema, no clubbing or cyanosis  Labs: Results for orders placed or performed during the hospital encounter of 10/06/14 (from the past 336 hour(s))  CBC   Collection Time: 10/06/14  8:49 AM  Result Value Ref Range   WBC 8.9  4.0 - 10.5 K/uL   RBC 4.05 3.87 - 5.11 MIL/uL   Hemoglobin 12.0 12.0 - 15.0 g/dL   HCT 72.5 36.6 - 44.0 %   MCV 91.4 78.0 - 100.0 fL   MCH 29.6 26.0 - 34.0 pg   MCHC 32.4 30.0 - 36.0 g/dL   RDW 34.7 42.5 - 95.6 %   Platelets 272 150 - 400 K/uL  Pregnancy, urine   Collection Time: 10/06/14  8:50 AM  Result Value Ref Range   Preg Test, Ur NEGATIVE NEGATIVE    Imaging Studies: No results found.  Assessment: Patient Active Problem List   Diagnosis Date Noted  . Genital warts 08/10/2014    Plan: Patient will undergo surgical management with resection of genital warts.   The risks of surgery were discussed in detail with the patient including but not limited to: bleeding which may require transfusion or reoperation; infection which may require antibiotics; injury to surrounding organs which may involve bowel, bladder, ureters ; need for additional procedures including laparoscopy or laparotomy; thromboembolic phenomenon, surgical site problems and other postoperative/anesthesia complications. Likelihood of success in alleviating the patient's condition was discussed. Routine postoperative instructions will be reviewed with the patient and her family in detail after surgery.  The patient concurred with the proposed plan, giving informed written consent for the surgery.  Patient has been NPO since last night she will remain NPO for procedure.  Anesthesia and OR aware.  Preoperative prophylactic antibiotics and SCDs ordered on call to the OR.  To OR when ready.  Alphonza Tramell L. Erin Fulling, M.D., [facility] 10/06/2014 9:36 AM

## 2014-10-06 NOTE — Brief Op Note (Signed)
10/06/2014  1:22 PM  PATIENT:  Laura Santos  24 y.o. female  PRE-OPERATIVE DIAGNOSIS:  cpt 11420 - Extensive genital warts  POST-OPERATIVE DIAGNOSIS:  extensive genital warts  PROCEDURE:  Procedure(s) with comments: CONDYLOMA REMOVAL (N/A) - and condyloma removal in anal area  SURGEON:  Surgeon(s) and Role:    * Willodean Rosenthal, MD - Primary  ANESTHESIA:   general  EBL:  Total I/O In: 1300 [I.V.:1300] Out: 20 [Urine:20]  BLOOD ADMINISTERED:none  DRAINS: none   LOCAL MEDICATIONS USED:  NONE  SPECIMEN:  Source of Specimen:  genital warts   DISPOSITION OF SPECIMEN:  PATHOLOGY  COUNTS:  YES  TOURNIQUET:  * No tourniquets in log *  DICTATION: .Note written in EPIC  PLAN OF CARE: Discharge to home after PACU  PATIENT DISPOSITION:  PACU - hemodynamically stable.   Delay start of Pharmacological VTE agent (>24hrs) due to surgical blood loss or risk of bleeding: not applicable  Complications: none immediate

## 2014-10-06 NOTE — Anesthesia Postprocedure Evaluation (Signed)
  Anesthesia Post-op Note  Patient: Laura Santos  Procedure(s) Performed: Procedure(s) with comments: CONDYLOMA REMOVAL (N/A) - and condyloma removal in anal area  Patient Location: PACU  Anesthesia Type:General  Level of Consciousness: awake, alert  and oriented  Airway and Oxygen Therapy: Patient Spontanous Breathing  Post-op Pain: mild  Post-op Assessment: Post-op Vital signs reviewed, Patient's Cardiovascular Status Stable, Respiratory Function Stable, Patent Airway, No signs of Nausea or vomiting and Pain level controlled              Post-op Vital Signs: Reviewed and stable  Last Vitals:  Filed Vitals:   10/06/14 1530  BP: 100/65  Pulse: 57  Temp: 36.7 C  Resp: 16    Complications: No apparent anesthesia complications

## 2014-10-06 NOTE — Discharge Instructions (Signed)
Genital Warts Genital warts are a sexually transmitted infection. They may appear as small bumps on the tissues of the genital area. CAUSES  Genital warts are caused by a virus called human papillomavirus (HPV). HPV is the most common sexually transmitted disease (STD) and infection of the sex organs. This infection is spread by having unprotected sex with an infected person. It can be spread by vaginal, anal, and oral sex. Many people do not know they are infected. They may be infected for years without problems. However, even if they do not have problems, they can unknowingly pass the infection to their sexual partners. SYMPTOMS   Itching and irritation in the genital area.  Warts that bleed.  Painful sexual intercourse. DIAGNOSIS  Warts are usually recognized with the naked eye on the vagina, vulva, perineum, anus, and rectum. Certain tests can also diagnose genital warts, such as:  A Pap test.  A tissue sample (biopsy) exam.  Colposcopy. A magnifying tool is used to examine the vagina and cervix. The HPV cells will change color when certain solutions are used. TREATMENT  Warts can be removed by:  Applying certain chemicals, such as cantharidin or podophyllin.  Liquid nitrogen freezing (cryotherapy).  Immunotherapy with Candida or Trichophyton injections.  Laser treatment.  Burning with an electrified probe (electrocautery).  Interferon injections.  Surgery. PREVENTION  HPV vaccination can help prevent HPV infections that cause genital warts and that cause cancer of the cervix. It is recommended that the vaccination be given to people between the ages 9 to 26 years old. The vaccine might not work as well or might not work at all if you already have HPV. It should not be given to pregnant women. HOME CARE INSTRUCTIONS   It is important to follow your caregiver's instructions. The warts will not go away without treatment. Repeat treatments are often needed to get rid of warts.  Even after it appears that the warts are gone, the normal tissue underneath often remains infected.  Do not try to treat genital warts with medicine used to treat hand warts. This type of medicine is strong and can burn the skin in the genital area, causing more damage.  Tell your past and current sexual partner(s) that you have genital warts. They may be infected also and need treatment.  Avoid sexual contact while being treated.  Do not touch or scratch the warts. The infection may spread to other parts of your body.  Women with genital warts should have a cervical cancer check (Pap test) at least once a year. This type of cancer is slow-growing and can be cured if found early. Chances of developing cervical cancer are increased with HPV.  Inform your obstetrician about your warts in the event of pregnancy. This virus can be passed to the baby's respiratory tract. Discuss this with your caregiver.  Use a condom during sexual intercourse. Following treatment, the use of condoms will help prevent reinfection.  Ask your caregiver about using over-the-counter anti-itch creams. SEEK MEDICAL CARE IF:   Your treated skin becomes red, swollen, or painful.  You have a fever.  You feel generally ill.  You feel little lumps in and around your genital area.  You are bleeding or have painful sexual intercourse. MAKE SURE YOU:   Understand these instructions.  Will watch your condition.  Will get help right away if you are not doing well or get worse. Document Released: 01/14/2000 Document Revised: 06/02/2013 Document Reviewed: 07/25/2010 Exit[employer] Patient Information 2015 [employer]. This   information is not intended to replace advice given to you by your health care provider. Make sure you discuss any questions you have with your health care provider.  

## 2014-10-06 NOTE — Anesthesia Procedure Notes (Signed)
Procedure Name: LMA Insertion Date/Time: 10/06/2014 12:15 PM Performed by: Fanny Dance L Pre-anesthesia Checklist: Patient identified, Emergency Drugs available, Suction available and Patient being monitored Patient Re-evaluated:Patient Re-evaluated prior to inductionOxygen Delivery Method: Circle system utilized Preoxygenation: Pre-oxygenation with 100% oxygen Intubation Type: IV induction Ventilation: Mask ventilation without difficulty LMA: LMA inserted LMA Size: 4.0 Number of attempts: 1 Placement Confirmation: positive ETCO2 and breath sounds checked- equal and bilateral Dental Injury: Teeth and Oropharynx as per pre-operative assessment

## 2014-10-07 ENCOUNTER — Encounter (HOSPITAL_COMMUNITY): Payer: Self-pay | Admitting: Obstetrics & Gynecology

## 2015-01-09 ENCOUNTER — Inpatient Hospital Stay (HOSPITAL_COMMUNITY): Payer: Medicaid Other

## 2015-01-09 ENCOUNTER — Inpatient Hospital Stay (HOSPITAL_COMMUNITY)
Admission: AD | Admit: 2015-01-09 | Discharge: 2015-01-09 | Disposition: A | Discharge status: Home / Self Care | Payer: Medicaid Other | Source: Ambulatory Visit | Attending: Obstetrics and Gynecology | Admitting: Obstetrics and Gynecology

## 2015-01-09 ENCOUNTER — Encounter (HOSPITAL_COMMUNITY): Payer: Self-pay | Admitting: *Deleted

## 2015-01-09 DIAGNOSIS — Z3A11 11 weeks gestation of pregnancy: Secondary | ICD-10-CM | POA: Insufficient documentation

## 2015-01-09 DIAGNOSIS — O209 Hemorrhage in early pregnancy, unspecified: Secondary | ICD-10-CM

## 2015-01-09 DIAGNOSIS — Z87891 Personal history of nicotine dependence: Secondary | ICD-10-CM | POA: Insufficient documentation

## 2015-01-09 DIAGNOSIS — R109 Unspecified abdominal pain: Secondary | ICD-10-CM

## 2015-01-09 DIAGNOSIS — M549 Dorsalgia, unspecified: Secondary | ICD-10-CM | POA: Diagnosis present

## 2015-01-09 DIAGNOSIS — O26899 Other specified pregnancy related conditions, unspecified trimester: Secondary | ICD-10-CM

## 2015-01-09 DIAGNOSIS — R58 Hemorrhage, not elsewhere classified: Secondary | ICD-10-CM

## 2015-01-09 LAB — URINALYSIS, ROUTINE W REFLEX MICROSCOPIC
Bilirubin Urine: NEGATIVE
Glucose, UA: NEGATIVE mg/dL
Hgb urine dipstick: NEGATIVE
Ketones, ur: NEGATIVE mg/dL
Leukocytes, UA: NEGATIVE
Nitrite: NEGATIVE
Protein, ur: NEGATIVE mg/dL
Specific Gravity, Urine: 1.025 (ref 1.005–1.030)
pH: 6 (ref 5.0–8.0)

## 2015-01-09 LAB — POCT PREGNANCY, URINE: PREG TEST UR: POSITIVE — AB

## 2015-01-09 NOTE — MAU Provider Note (Signed)
History     CSN: [id number]  Arrival date and time: 01/09/15 1439   First Provider Initiated Contact with Patient 01/09/15 1521      Chief Complaint  Patient presents with  . Vaginal Bleeding  . Back Pain  . Flank Pain   HPI  Laura Santos 24 y.o. [id number] [redacted]w[redacted]d presents to MAU stating that she passed a small blood clot earlier today and is having back pain.  Past Medical History  Diagnosis Date  . HPV-genital warts   . Abnormal Pap smear   . History of chlamydia   . History of gonorrhea   . Vaginal delivery 2012 2015    Past Surgical History  Procedure Laterality Date  . Wisdom tooth extraction    . Condyloma excision/fulguration N/A 10/06/2014    Procedure: CONDYLOMA REMOVAL;  Surgeon: Willodean Rosenthal, MD;  Location: [facility];  Service: Gynecology;  Laterality: N/A;  and condyloma removal in anal area    Family History  Problem Relation Age of Onset  . Hypertension Mother   . Diabetes Maternal Aunt   . Hypertension Maternal Aunt   . Diabetes Maternal Grandmother   . Hypertension Maternal Grandmother   . Diabetes Cousin     Social History  Substance Use Topics  . Smoking status: Former Smoker -- 0.50 packs/day for 4 years    Types: Cigarettes    Quit date: 08/16/2010  . Smokeless tobacco: Never Used  . Alcohol Use: No     Comment: Occasional    Allergies: No Known Allergies  Prescriptions prior to admission  Medication Sig Dispense Refill Last Dose  . silver sulfADIAZINE (SILVADENE) 1 % cream Apply 1 application topically daily. 50 g 0   . traMADol (ULTRAM) 50 MG tablet Take 1 tablet (50 mg total) by mouth every 6 (six) hours as needed. 15 tablet 0     Review of Systems  Constitutional: Negative for fever.  Genitourinary:       Vaginal bleeding  Musculoskeletal: Positive for back pain.  All other systems reviewed and are negative.  Physical Exam   Blood pressure 125/80, pulse 78, temperature 98.6 F (37 C), temperature source Oral,  resp. rate 18, last menstrual period 10/21/2014, not currently breastfeeding.  Physical Exam  Nursing note and vitals reviewed. Constitutional: She is oriented to person, place, and time. She appears well-developed and well-nourished.  HENT:  Head: Normocephalic and atraumatic.  Cardiovascular: Normal rate and regular rhythm.   Respiratory: Effort normal and breath sounds normal. No respiratory distress.  GI: Soft. There is no tenderness.  Musculoskeletal: Normal range of motion.  Neurological: She is alert and oriented to person, place, and time.  Skin: Skin is warm and dry.  Psychiatric: She has a normal mood and affect. Her behavior is normal. Judgment and thought content normal.   Results for orders placed or performed during the hospital encounter of 01/09/15 (from the past 24 hour(s))  Urinalysis, Routine w reflex microscopic (not at [facility])     Status: None   Collection Time: 01/09/15  2:55 PM  Result Value Ref Range   Color, Urine YELLOW YELLOW   APPearance CLEAR CLEAR   Specific Gravity, Urine 1.025 1.005 - 1.030   pH 6.0 5.0 - 8.0   Glucose, UA NEGATIVE NEGATIVE mg/dL   Hgb urine dipstick NEGATIVE NEGATIVE   Bilirubin Urine NEGATIVE NEGATIVE   Ketones, ur NEGATIVE NEGATIVE mg/dL   Protein, ur NEGATIVE NEGATIVE mg/dL   Nitrite NEGATIVE NEGATIVE   Leukocytes, UA NEGATIVE NEGATIVE  Pregnancy, urine POC     Status: Abnormal   Collection Time: 01/09/15  3:03 PM  Result Value Ref Range   Preg Test, Ur POSITIVE (A) NEGATIVE  U/S normal. Only prelim results back at this time  MAU Course  Procedures  MDM Will send pt to ultrasound to verify dating; placenta; Bleeding resolved. Back pain resolved. Tylenol for back pain; Start prenatal care; List of meds and providers given.  Assessment and Plan  First Trimester Bleeding  Discharge  Clemmons,Lori Grissett 01/09/2015, 3:31 PM

## 2015-01-09 NOTE — MAU Note (Signed)
Pt noted blood with wiping this a.m., small blood clot.  Lower back & L side have been hurting since last night.

## 2015-01-09 NOTE — Discharge Instructions (Signed)
Safe Medications in Pregnancy   Acne: Benzoyl Peroxide Salicylic Acid  Backache/Headache: Tylenol: 2 regular strength every 4 hours OR              2 Extra strength every 6 hours  Colds/Coughs/Allergies: Benadryl (alcohol free) 25 mg every 6 hours as needed Breath right strips Claritin Cepacol throat lozenges Chloraseptic throat spray Cold-Eeze- up to three times per day Cough drops, alcohol free Flonase (by prescription only) Guaifenesin Mucinex Robitussin DM (plain only, alcohol free) Saline nasal spray/drops Sudafed (pseudoephedrine) & Actifed ** use only after [redacted] weeks gestation and if you do not have high blood pressure Tylenol Vicks Vaporub Zinc lozenges Zyrtec   Constipation: Colace Ducolax suppositories Fleet enema Glycerin suppositories Metamucil Milk of magnesia Miralax Senokot Smooth move tea  Diarrhea: Kaopectate Imodium A-D  *NO pepto Bismol  Hemorrhoids: Anusol Anusol HC Preparation H Tucks  Indigestion: Tums Maalox Mylanta Zantac  Pepcid  Insomnia: Benadryl (alcohol free) 25mg  every 6 hours as needed Tylenol PM Unisom, no Gelcaps  Leg Cramps: Tums MagGel  Nausea/Vomiting:  Bonine Dramamine Emetrol Ginger extract Sea bands Meclizine  Nausea medication to take during pregnancy:  Unisom (doxylamine succinate 25 mg tablets) Take one tablet daily at bedtime. If symptoms are not adequately controlled, the dose can be increased to a maximum recommended dose of two tablets daily (1/2 tablet in the morning, 1/2 tablet mid-afternoon and one at bedtime). Vitamin B6 100mg  tablets. Take one tablet twice a day (up to 200 mg per day).  Skin Rashes: Aveeno products Benadryl cream or 25mg  every 6 hours as needed Calamine Lotion 1% cortisone cream  Yeast infection: Gyne-lotrimin 7 Monistat 7   **If taking multiple medications, please check labels to avoid duplicating the same active ingredients **take medication as directed on  the label ** Do not exceed 4000 mg of tylenol in 24 hours **Do not take medications that contain aspirin or ibuprofen   Safe Medications in Pregnancy   Acne: Benzoyl Peroxide Salicylic Acid  Backache/Headache: Tylenol: 2 regular strength every 4 hours OR              2 Extra strength every 6 hours  Colds/Coughs/Allergies: Benadryl (alcohol free) 25 mg every 6 hours as needed Breath right strips Claritin Cepacol throat lozenges Chloraseptic throat spray Cold-Eeze- up to three times per day Cough drops, alcohol free Flonase (by prescription only) Guaifenesin Mucinex Robitussin DM (plain only, alcohol free) Saline nasal spray/drops Sudafed (pseudoephedrine) & Actifed ** use only after [redacted] weeks gestation and if you do not have high blood pressure Tylenol Vicks Vaporub Zinc lozenges Zyrtec   Constipation: Colace Ducolax suppositories Fleet enema Glycerin suppositories Metamucil Milk of magnesia Miralax Senokot Smooth move tea  Diarrhea: Kaopectate Imodium A-D  *NO pepto Bismol  Hemorrhoids: Anusol Anusol HC Preparation H Tucks  Indigestion: Tums Maalox Mylanta Zantac  Pepcid  Insomnia: Benadryl (alcohol free) 25mg  every 6 hours as needed Tylenol PM Unisom, no Gelcaps  Leg Cramps: Tums MagGel  Nausea/Vomiting:  Bonine Dramamine Emetrol Ginger extract Sea bands Meclizine  Nausea medication to take during pregnancy:  Unisom (doxylamine succinate 25 mg tablets) Take one tablet daily at bedtime. If symptoms are not adequately controlled, the dose can be increased to a maximum recommended dose of two tablets daily (1/2 tablet in the morning, 1/2 tablet mid-afternoon and one at bedtime). Vitamin B6 100mg  tablets. Take one tablet twice a day (up to 200 mg per day).  Skin Rashes: Aveeno products Benadryl cream or 25mg  every 6 hours  as needed Calamine Lotion 1% cortisone cream  Yeast infection: Gyne-lotrimin 7 Monistat 7   **If taking  multiple medications, please check labels to avoid duplicating the same active ingredients **take medication as directed on the label ** Do not exceed 4000 mg of tylenol in 24 hours **Do not take medications that contain aspirin or ibuprofen   Prenatal Care Providers [facility]    [facility]  & Infertility  [phone]     Phone: [phone]          [facility]                      [facility]   [place]     Phone: [phone]  Phone: [phone]         [facility]     Phone: [phone]  Phone: [phone]           [facility] @ [place]                hone: [phone]  Phone: [phone]         [facility] [address] Francoise Ceo      Phone: [phone]  Phone: [phone]         [facility]                Phone: [phone]  [facility]   [address] [place])          Phone: [phone] [facility]   Phone: [phone] [facility]    [facility]  & Infertility  [phone]     Phone: [phone]          [facility]                      [facility] [place]   [place]     Phone: [phone]  Phone: [phone]         [facility]     Phone: [phone]  Phone: [phone]           [facility] @ [place]                hone: [phone]  Phone: [phone]         [facility] [address]ise Ceo      Phone: [phone]  Phone: [phone]         [facility]                Phone: [phone]  [facility]   [phone]    Family [address] [place])          Phone: [phone] [facility]   Phone: [phone] Vitamin and  Mineral Combinations (oral solid dosage forms) What is this medicine? PRENATAL VITAMIN AND MINERAL combinations are used before, during, and after pregnancy to help provide provide good nutrition. This medicine may be used for other purposes; ask your health care provider or pharmacist if you have questions. What should I tell my health care provider before I take this medicine? They need to know if you have any of these conditions: -bleeding or clotting disorder -history of anemia of any type -other chronic health condition -an  unusual or allergic reaction to vitamins, minerals, other medicines, foods, dyes, or preservatives How should I use this medicine? Take this medicine by mouth with a glass of water. You can take it with or without food. If it upsets your stomach, take it with food. Chewable prenatal vitamin tablets may be chewed completely before swallowing. Follow the directions on the prescription label. The usual dose is taken once a day. Do not take your medicine more often than directed. Contact your pediatrician regarding the use of this medicine in children. Special care may be needed. This medicine is intended for females who are pregnant, breast-feeding, or may become pregnant. Overdosage: If you think you have taken too much of this medicine contact a poison control center or emergency room at once. NOTE: This medicine is only for you. Do not share this medicine with others. What if I miss a dose? If you miss a dose, take it as soon as you can. If it is almost time for your next dose, take only that dose. Do not take double or extra doses. What may interact with this medicine? -alendronate -antacids -cefdinir -cefditoren -etidronate -fluoroquinolone antibiotics (examples: ciprofloxacin, gatifloxacin, levofloxacin) -ibandronate -levodopa -risedronate -tetracycline antibiotics (examples: doxycycline, minocycline, tetracycline) -thyroid hormones -warfarin This list may  not describe all possible interactions. Give your health care provider a list of all the medicines, herbs, non-prescription drugs, or dietary supplements you use. Also tell them if you smoke, drink alcohol, or use illegal drugs. Some items may interact with your medicine. What should I watch for while using this medicine? See your health care professional for regular checks on your progress. Remember that vitamin and mineral supplements do not replace the need for good nutrition from a balanced diet. Stools commonly change color when vitamins and minerals are taken. Notify your health care professional if this change is alarming or accompanied by other symptoms, like abdominal pain. What side effects may I notice from receiving this medicine? Side effects that you should report to your doctor or health care professional as soon as possible: -allergic reaction such as skin rash or difficulty breathing -vomiting Side effects that usually do not require medical attention (report to your doctor or health care professional if they continue or are bothersome): -nausea -stomach upset This list may not describe all possible side effects. Call your doctor for medical advice about side effects. You may report side effects to FDA at 1-800-FDA-1088. Where should I keep my medicine? Keep out of the reach of children. Most vitamins and minerals should be stored at controlled room temperature. Check your specific product directions. Protect from heat and moisture. Throw away any unused medicine after the expiration date. NOTE: This sheet is a summary. It may not cover all possible information. If you have questions about this medicine, talk to your doctor, pharmacist, or health care provider.    2016, Elsevier/Gold Standard. (2014-08-13 09:02:38) Back Pain in Pregnancy Back pain during pregnancy is common. It happens in about half of all pregnancies. It is important for you and your baby that you remain active  during your pregnancy.If you feel that back pain is not allowing you to remain active or sleep well, it is time to see your caregiver. Back pain may be caused by several factors related to changes during your pregnancy.Fortunately, unless you had trouble with your back before your pregnancy, the pain is likely to get better after you deliver. Low back pain usually occurs between the fifth and seventh months of pregnancy. It  can, however, happen in the first couple months. Factors that increase the risk of back problems include:   Previous back problems.  Injury to your back.  Having twins or multiple births.  A chronic cough.  Stress.  Job-related repetitive motions.  Muscle or spinal disease in the back.  Family history of back problems, ruptured (herniated) discs, or osteoporosis.  Depression, anxiety, and panic attacks. CAUSES   When you are pregnant, your body produces a hormone called relaxin. This hormonemakes the ligaments connecting the low back and pubic bones more flexible. This flexibility allows the baby to be delivered more easily. When your ligaments are loose, your muscles need to work harder to support your back. Soreness in your back can come from tired muscles. Soreness can also come from back tissues that are irritated since they are receiving less support.  As the baby grows, it puts pressure on the nerves and blood vessels in your pelvis. This can cause back pain.  As the baby grows and gets heavier during pregnancy, the uterus pushes the stomach muscles forward and changes your center of gravity. This makes your back muscles work harder to maintain good posture. SYMPTOMS  Lumbar pain during pregnancy Lumbar pain during pregnancy usually occurs at or above the waist in the center of the back. There may be pain and numbness that radiates into your leg or foot. This is similar to low back pain experienced by non-pregnant women. It usually increases with sitting for  long periods of time, standing, or repetitive lifting. Tenderness may also be present in the muscles along your upper back. Posterior pelvic pain during pregnancy Pain in the back of the pelvis is more common than lumbar pain in pregnancy. It is a deep pain felt in your side at the waistline, or across the tailbone (sacrum), or in both places. You may have pain on one or both sides. This pain can also go into the buttocks and backs of the upper thighs. Pubic and groin pain may also be present. The pain does not quickly resolve with rest, and morning stiffness may also be present. Pelvic pain during pregnancy can be brought on by most activities. A high level of fitness before and during pregnancy may or may not prevent this problem. Labor pain is usually 1 to 2 minutes apart, lasts for about 1 minute, and involves a bearing down feeling or pressure in your pelvis. However, if you are at term with the pregnancy, constant low back pain can be the beginning of early labor, and you should be aware of this. DIAGNOSIS  X-rays of the back should not be done during the first 12 to 14 weeks of the pregnancy and only when absolutely necessary during the rest of the pregnancy. MRIs do not give off radiation and are safe during pregnancy. MRIs also should only be done when absolutely necessary. HOME CARE INSTRUCTIONS  Exercise as directed by your caregiver. Exercise is the most effective way to prevent or manage back pain. If you have a back problem, it is especially important to avoid sports that require sudden body movements. Swimming and walking are great activities.  Do not stand in one place for long periods of time.  Do not wear high heels.  Sit in chairs with good posture. Use a pillow on your lower back if necessary. Make sure your head rests over your shoulders and is not hanging forward.  Try sleeping on your side, preferably the left side, with a pillow or two  between your legs. If you are sore after  a night's rest, your bedmay betoo soft.Try placing a board between your mattress and box spring.  Listen to your body when lifting.If you are experiencing pain, ask for help or try bending yourknees more so you can use your leg muscles rather than your back muscles. Squat down when picking up something from the floor. Do not bend over.  Eat a healthy diet. Try to gain weight within your caregiver's recommendations.  Use heat or cold packs 3 to 4 times a day for 15 minutes to help with the pain.  Only take over-the-counter or prescription medicines for pain, discomfort, or fever as directed by your caregiver. Sudden (acute) back pain  Use bed rest for only the most extreme, acute episodes of back pain. Prolonged bed rest over 48 hours will aggravate your condition.  Ice is very effective for acute conditions.  Put ice in a plastic bag.  Place a towel between your skin and the bag.  Leave the ice on for 10 to 20 minutes every 2 hours, or as needed.  Using heat packs for 30 minutes prior to activities is also helpful. Continued back pain See your caregiver if you have continued problems. Your caregiver can help or refer you for appropriate physical therapy. With conditioning, most back problems can be avoided. Sometimes, a more serious issue may be the cause of back pain. You should be seen right away if new problems seem to be developing. Your caregiver may recommend:  A maternity girdle.  An elastic sling.  A back brace.  A massage therapist or acupuncture. SEEK MEDICAL CARE IF:   You are not able to do most of your daily activities, even when taking the pain medicine you were given.  You need a referral to a physical therapist or chiropractor.  You want to try acupuncture. SEEK IMMEDIATE MEDICAL CARE IF:  You develop numbness, tingling, weakness, or problems with the use of your arms or legs.  You develop severe back pain that is no longer relieved with  medicines.  You have a sudden change in bowel or bladder control.  You have increasing pain in other areas of the body.  You develop shortness of breath, dizziness, or fainting.  You develop nausea, vomiting, or sweating.  You have back pain which is similar to labor pains.  You have back pain along with your water breaking or vaginal bleeding.  You have back pain or numbness that travels down your leg.  Your back pain developed after you fell.  You develop pain on one side of your back. You may have a kidney stone.  You see blood in your urine. You may have a bladder infection or kidney stone.  You have back pain with blisters. You may have shingles. Back pain is fairly common during pregnancy but should not be accepted as just part of the process. Back pain should always be treated as soon as possible. This will make your pregnancy as pleasant as possible.   This information is not intended to replace advice given to you by your health care provider. Make sure you discuss any questions you have with your health care provider.   Document Released: 04/26/2005 Document Revised: 04/10/2011 Document Reviewed: 06/07/2010 Elsevier Interactive Patient Education [employer]2016 Elsevier Inc.

## 2015-02-15 ENCOUNTER — Emergency Department (HOSPITAL_BASED_OUTPATIENT_CLINIC_OR_DEPARTMENT_OTHER)
Admission: EM | Admit: 2015-02-15 | Discharge: 2015-02-15 | Disposition: A | Discharge status: Home / Self Care | Payer: Medicaid Other | Attending: Emergency Medicine | Admitting: Emergency Medicine

## 2015-02-15 ENCOUNTER — Encounter (HOSPITAL_BASED_OUTPATIENT_CLINIC_OR_DEPARTMENT_OTHER): Payer: Self-pay | Admitting: *Deleted

## 2015-02-15 ENCOUNTER — Emergency Department (HOSPITAL_BASED_OUTPATIENT_CLINIC_OR_DEPARTMENT_OTHER): Payer: Medicaid Other

## 2015-02-15 DIAGNOSIS — Z87891 Personal history of nicotine dependence: Secondary | ICD-10-CM | POA: Diagnosis not present

## 2015-02-15 DIAGNOSIS — Z3A16 16 weeks gestation of pregnancy: Secondary | ICD-10-CM | POA: Diagnosis not present

## 2015-02-15 DIAGNOSIS — Z8619 Personal history of other infectious and parasitic diseases: Secondary | ICD-10-CM | POA: Diagnosis not present

## 2015-02-15 DIAGNOSIS — Z3490 Encounter for supervision of normal pregnancy, unspecified, unspecified trimester: Secondary | ICD-10-CM

## 2015-02-15 DIAGNOSIS — O9989 Other specified diseases and conditions complicating pregnancy, childbirth and the puerperium: Secondary | ICD-10-CM | POA: Diagnosis not present

## 2015-02-15 DIAGNOSIS — O99282 Endocrine, nutritional and metabolic diseases complicating pregnancy, second trimester: Secondary | ICD-10-CM | POA: Insufficient documentation

## 2015-02-15 DIAGNOSIS — R109 Unspecified abdominal pain: Secondary | ICD-10-CM | POA: Insufficient documentation

## 2015-02-15 DIAGNOSIS — E860 Dehydration: Secondary | ICD-10-CM | POA: Insufficient documentation

## 2015-02-15 LAB — URINALYSIS, ROUTINE W REFLEX MICROSCOPIC
BILIRUBIN URINE: NEGATIVE
GLUCOSE, UA: NEGATIVE mg/dL
HGB URINE DIPSTICK: NEGATIVE
Ketones, ur: 15 mg/dL — AB
Leukocytes, UA: NEGATIVE
Nitrite: NEGATIVE
PROTEIN: NEGATIVE mg/dL
SPECIFIC GRAVITY, URINE: 1.036 — AB (ref 1.005–1.030)
pH: 6 (ref 5.0–8.0)

## 2015-02-15 MED ORDER — PRENATAL VITAMINS 28-0.8 MG PO TABS: 1.0000 | ORAL_TABLET | Freq: Every day | ORAL | Status: AC

## 2015-02-15 MED ORDER — ACETAMINOPHEN 500 MG PO TABS
1000.0000 mg | ORAL_TABLET | Freq: Once | ORAL | Status: AC
  Administered 2015-02-15: 1000 mg via ORAL
  Filled 2015-02-15: qty 2

## 2015-02-15 NOTE — ED Provider Notes (Signed)
CSN: [id number]     Arrival date & time 02/15/15  1618 History   First MD Initiated Contact with Patient 02/15/15 1920     Chief Complaint  Patient presents with  . Abdominal Pain     (Consider location/radiation/quality/duration/timing/severity/associated sxs/prior Treatment) The history is provided by the patient.     [id number] currently [redacted] weeks pregnant with confirmed IUP presents with 1 week of intermittent left flank pain, worse over the past 2-3 days.  Pain is throbbing, worse with movement and stretching.  Has taken tylenol without improvement.   During her early pregnancy she had 2 weeks of urinary urgency and decreased output but this has completely resolved.  She had vaginal spotting 01/09/15 and [facility].  Had very small amount of light pink spotting today.  Denies fevers, chills, myalgias, abnormal vaginal discharge, urinary symptoms, bowel changes, cough, SOB, chest pain.  She is not yet feeling fetal movement.  Denies heavy lifting or any injuries.   Eating and drinking well. She has not started prenatal care.  She is not taking prenatal vitamins.    Past Medical History  Diagnosis Date  . HPV-genital warts   . Abnormal Pap smear   . History of chlamydia   . History of gonorrhea   . Vaginal delivery 2012 2015   Past Surgical History  Procedure Laterality Date  . Wisdom tooth extraction    . Condyloma excision/fulguration N/A 10/06/2014    Procedure: CONDYLOMA REMOVAL;  Surgeon: Willodean Rosenthalarolyn Harraway-Smith, MD;  Location: [facility];  Service: Gynecology;  Laterality: N/A;  and condyloma removal in anal area   Family History  Problem Relation Age of Onset  . Hypertension Mother   . Diabetes Maternal Aunt   . Hypertension Maternal Aunt   . Diabetes Maternal Grandmother   . Hypertension Maternal Grandmother   . Diabetes Cousin    Social History  Substance Use Topics  . Smoking status: Former Smoker -- 0.50 packs/day for 4 years    Types:  Cigarettes    Quit date: 08/16/2010  . Smokeless tobacco: Never Used  . Alcohol Use: No     Comment: Occasional   OB History    Gravida Para Term Preterm AB TAB SAB Ectopic Multiple Living   3 2 2  0 0 0 0 0 0 2     Review of Systems  All other systems reviewed and are negative.     Allergies  Review of patient's allergies indicates no known allergies.  Home Medications   Prior to Admission medications   Not on File   BP 137/57 mmHg  Pulse 95  Temp(Src) 98.3 F (36.8 C) (Oral)  Resp 18  Ht 5' 6.5" (1.689 m)  Wt 70.761 kg  BMI 24.80 kg/m2  SpO2 99%  LMP 10/21/2014 Physical Exam  Constitutional: She appears well-developed and well-nourished. No distress.  HENT:  Head: Normocephalic and atraumatic.  Neck: Neck supple.  Cardiovascular: Normal rate and regular rhythm.   Pulmonary/Chest: Effort normal and breath sounds normal. No respiratory distress. She has no wheezes. She has no rales.  Abdominal: Soft. She exhibits no distension. There is tenderness. There is CVA tenderness. There is no rebound and no guarding.  Gravid.  Vague tenderness of left flank, worse in the back, +mild left CVA area tenderness.  NO skin changes or discoloration or rash.    Neurological: She is alert.  Skin: She is not diaphoretic.  Nursing note and vitals reviewed.   ED Course  Procedures (including critical care time) Labs Review Labs Reviewed  URINALYSIS, ROUTINE W REFLEX MICROSCOPIC (NOT AT [facility]) - Abnormal; Notable for the following:    Specific Gravity, Urine 1.036 (*)    Ketones, ur 15 (*)    All other components within normal limits  URINE CULTURE    Imaging Review US Renal  02/15/2015  CLINICAL DATA:  25 year old female with left-sided flank pain for the past 2-3 weeks. Remote history of urinary tract infections. EXAM: RENAL / URINARY TRACT ULTRASOUND COMPLETE COMPARISON:  No priors. FINDINGS: Right Kidney: Length: 10.3 cm. Echogenicity within normal limits. No mass or  hydronephrosis visualized. Left Kidney: Length: 9.8 cm. Echogenicity within normal limits. No mass or hydronephrosis visualized. Bladder: Appears normal for degree of bladder distention. IMPRESSION: 1. No acute findings. Specifically, no hydronephrosis. The appearance of the kidneys and urinary bladder is normal. Electronically Signed   By: Trudie Reed M.D.   On: 02/15/2015 20:36     EKG Interpretation None      MDM   Final diagnoses:  Abdominal wall pain in left flank  Pregnant    Afebrile nontoxic pregnant patient with one month of left flank pain.  UA remarkable for mild dehydration.  Urine culture pending.  US renal  Negative.  Fetal heart tones checked, rate 150.  IUP already confirmed.  The pain is worse with movement and stretching upward, worse in the left lower back.  Likely muscular.  Pt has not followed up with OB or started prenatal care.  Advised close follow up, tylenol for pain.  D/C home with OB follow up, prenatal vitamins. Advised hydration. Discussed result, findings, treatment, and follow up  with patient.  Pt given return precautions.  Pt verbalizes understanding and agrees with plan.       Trixie Dredge, PA-C 02/15/15 2130  Rolan Bucco, MD 02/16/15 [phone]

## 2015-02-15 NOTE — Discharge Instructions (Signed)
Read the information below.  You may return to the Emergency Department at any time for worsening condition or any new symptoms that concern you.  If you develop high fevers, worsening abdominal pain, uncontrolled vomiting, or are unable to tolerate fluids by mouth, return to the ER for a recheck.    If you develop fevers, loss of control of bowel or bladder, weakness or numbness in your legs, or are unable to walk, return to the ER for a recheck.    Back Pain in Pregnancy Back pain during pregnancy is common. It happens in about half of all pregnancies. It is important for you and your baby that you remain active during your pregnancy.If you feel that back pain is not allowing you to remain active or sleep well, it is time to see your caregiver. Back pain may be caused by several factors related to changes during your pregnancy.Fortunately, unless you had trouble with your back before your pregnancy, the pain is likely to get better after you deliver. Low back pain usually occurs between the fifth and seventh months of pregnancy. It can, however, happen in the first couple months. Factors that increase the risk of back problems include:   Previous back problems.  Injury to your back.  Having twins or multiple births.  A chronic cough.  Stress.  Job-related repetitive motions.  Muscle or spinal disease in the back.  Family history of back problems, ruptured (herniated) discs, or osteoporosis.  Depression, anxiety, and panic attacks. CAUSES   When you are pregnant, your body produces a hormone called relaxin. This hormonemakes the ligaments connecting the low back and pubic bones more flexible. This flexibility allows the baby to be delivered more easily. When your ligaments are loose, your muscles need to work harder to support your back. Soreness in your back can come from tired muscles. Soreness can also come from back tissues that are irritated since they are receiving less  support.  As the baby grows, it puts pressure on the nerves and blood vessels in your pelvis. This can cause back pain.  As the baby grows and gets heavier during pregnancy, the uterus pushes the stomach muscles forward and changes your center of gravity. This makes your back muscles work harder to maintain good posture. SYMPTOMS  Lumbar pain during pregnancy Lumbar pain during pregnancy usually occurs at or above the waist in the center of the back. There may be pain and numbness that radiates into your leg or foot. This is similar to low back pain experienced by non-pregnant women. It usually increases with sitting for long periods of time, standing, or repetitive lifting. Tenderness may also be present in the muscles along your upper back. Posterior pelvic pain during pregnancy Pain in the back of the pelvis is more common than lumbar pain in pregnancy. It is a deep pain felt in your side at the waistline, or across the tailbone (sacrum), or in both places. You may have pain on one or both sides. This pain can also go into the buttocks and backs of the upper thighs. Pubic and groin pain may also be present. The pain does not quickly resolve with rest, and morning stiffness may also be present. Pelvic pain during pregnancy can be brought on by most activities. A high level of fitness before and during pregnancy may or may not prevent this problem. Labor pain is usually 1 to 2 minutes apart, lasts for about 1 minute, and involves a bearing down feeling or pressure  in your pelvis. However, if you are at term with the pregnancy, constant low back pain can be the beginning of early labor, and you should be aware of this. DIAGNOSIS  X-rays of the back should not be done during the first 12 to 14 weeks of the pregnancy and only when absolutely necessary during the rest of the pregnancy. MRIs do not give off radiation and are safe during pregnancy. MRIs also should only be done when absolutely  necessary. HOME CARE INSTRUCTIONS  Exercise as directed by your caregiver. Exercise is the most effective way to prevent or manage back pain. If you have a back problem, it is especially important to avoid sports that require sudden body movements. Swimming and walking are great activities.  Do not stand in one place for long periods of time.  Do not wear high heels.  Sit in chairs with good posture. Use a pillow on your lower back if necessary. Make sure your head rests over your shoulders and is not hanging forward.  Try sleeping on your side, preferably the left side, with a pillow or two between your legs. If you are sore after a night's rest, your bedmay betoo soft.Try placing a board between your mattress and box spring.  Listen to your body when lifting.If you are experiencing pain, ask for help or try bending yourknees more so you can use your leg muscles rather than your back muscles. Squat down when picking up something from the floor. Do not bend over.  Eat a healthy diet. Try to gain weight within your caregiver's recommendations.  Use heat or cold packs 3 to 4 times a day for 15 minutes to help with the pain.  Only take over-the-counter or prescription medicines for pain, discomfort, or fever as directed by your caregiver. Sudden (acute) back pain  Use bed rest for only the most extreme, acute episodes of back pain. Prolonged bed rest over 48 hours will aggravate your condition.  Ice is very effective for acute conditions.  Put ice in a plastic bag.  Place a towel between your skin and the bag.  Leave the ice on for 10 to 20 minutes every 2 hours, or as needed.  Using heat packs for 30 minutes prior to activities is also helpful. Continued back pain See your caregiver if you have continued problems. Your caregiver can help or refer you for appropriate physical therapy. With conditioning, most back problems can be avoided. Sometimes, a more serious issue may be the  cause of back pain. You should be seen right away if new problems seem to be developing. Your caregiver may recommend:  A maternity girdle.  An elastic sling.  A back brace.  A massage therapist or acupuncture. SEEK MEDICAL CARE IF:   You are not able to do most of your daily activities, even when taking the pain medicine you were given.  You need a referral to a physical therapist or chiropractor.  You want to try acupuncture. SEEK IMMEDIATE MEDICAL CARE IF:  You develop numbness, tingling, weakness, or problems with the use of your arms or legs.  You develop severe back pain that is no longer relieved with medicines.  You have a sudden change in bowel or bladder control.  You have increasing pain in other areas of the body.  You develop shortness of breath, dizziness, or fainting.  You develop nausea, vomiting, or sweating.  You have back pain which is similar to labor pains.  You have back pain along  with your water breaking or vaginal bleeding.  You have back pain or numbness that travels down your leg.  Your back pain developed after you fell.  You develop pain on one side of your back. You may have a kidney stone.  You see blood in your urine. You may have a bladder infection or kidney stone.  You have back pain with blisters. You may have shingles. Back pain is fairly common during pregnancy but should not be accepted as just part of the process. Back pain should always be treated as soon as possible. This will make your pregnancy as pleasant as possible.   This information is not intended to replace advice given to you by your health care provider. Make sure you discuss any questions you have with your health care provider.   Document Released: 04/26/2005 Document Revised: 04/10/2011 Document Reviewed: 06/07/2010 Elsevier Interactive Patient Education 2016 Elsevier Inc.  Flank Pain Flank pain refers to pain that is located on the side of the body between  the upper abdomen and the back. The pain may occur over a short period of time (acute) or may be long-term or reoccurring (chronic). It may be mild or severe. Flank pain can be caused by many things. CAUSES  Some of the more common causes of flank pain include:  Muscle strains.   Muscle spasms.   A disease of your spine (vertebral disk disease).   A lung infection (pneumonia).   Fluid around your lungs (pulmonary edema).   A kidney infection.   Kidney stones.   A very painful skin rash caused by the chickenpox virus (shingles).   Gallbladder disease.  HOME CARE INSTRUCTIONS  Home care will depend on the cause of your pain. In general,  Rest as directed by your caregiver.  Drink enough fluids to keep your urine clear or pale yellow.  Only take over-the-counter or prescription medicines as directed by your caregiver. Some medicines may help relieve the pain.  Tell your caregiver about any changes in your pain.  Follow up with your caregiver as directed. SEEK IMMEDIATE MEDICAL CARE IF:   Your pain is not controlled with medicine.   You have new or worsening symptoms.  Your pain increases.   You have abdominal pain.   You have shortness of breath.   You have persistent nausea or vomiting.   You have swelling in your abdomen.   You feel faint or pass out.   You have blood in your urine.  You have a fever or persistent symptoms for more than 2-3 days.  You have a fever and your symptoms suddenly get worse. MAKE SURE YOU:   Understand these instructions.  Will watch your condition.  Will get help right away if you are not doing well or get worse.   This information is not intended to replace advice given to you by your health care provider. Make sure you discuss any questions you have with your health care provider.   Document Released: 03/09/2005 Document Revised: 10/11/2011 Document Reviewed: 08/31/2011 Elsevier Interactive Patient  Education 2016 [employer].    Emergency Department Resource Guide 1) Find a Doctor and Pay Out of Pocket Although you won't have to find out who is covered by your insurance plan, it is a good idea to ask around and get recommendations. You will then need to call the office and see if the doctor you have chosen will accept you as a new patient and what types of options they offer for  patients who are self-pay. Some doctors offer discounts or will set up payment plans for their patients who do not have insurance, but you will need to ask so you aren't surprised when you get to your appointment.  2) Contact Your Local Health Department Not all health departments have doctors that can see patients for sick visits, but many do, so it is worth a call to see if yours does. If you don't know where your local health department is, you can check in your phone book. The CDC also has a tool to help you locate your state's health department, and many state websites also have listings of all of their local health departments.  3) Find a Walk-in Clinic If your illness is not likely to be very severe or complicated, you may want to try a walk in clinic. These are popping up all over the country in pharmacies, drugstores, and shopping centers. They're usually staffed by nurse practitioners or physician assistants that have been trained to treat common illnesses and complaints. They're usually fairly quick and inexpensive. However, if you have serious medical issues or chronic medical problems, these are probably not your best option.  No Primary Care Doctor: - Call Health Connect at  [phone] - they can help you locate a primary care doctor that  accepts your insurance, provides certain services, etc. - Physician Referral Service- [phone]  Chronic Pain Problems: Organization         Address  Phone   Notes  [facility]  [phone] Patients need to be referred by their primary  care doctor.   Medication Assistance: Organization         Address  Phone   Notes  [facility] Medication [facility] [address] [place] [place], [place] [address] [phone] --Must be a resident of [facility] -- Must have NO insurance coverage whatsoever (no Medicaid/ Medicare, etc.) -- The pt. MUST have a primary care doctor that directs their care regularly and follows them in the community   [facility]  [phone]   [employer]  [phone]    Agencies that provide inexpensive medical care: Organization         Address  Phone   Notes  [facility]  [phone]   [facility]    [phone]   [facility] [address] [place] [address] Dalan Cowger [place], [place] [address] [phone]   [facility] [address] [place] [place] [phone]   Planned Parenthood    [phone]   [facility]    [phone]   [facility]  [address], [place] Phone:  [phone], Fax:  [phone] Hours of Operation:  9 am - 6 pm, M-F.  Also accepts Medicaid/Medicare and self-pay.  [facility]  [address], [place] Phone: [phone], Fax: [phone]. Hours of Operation:  8:30 am - 5:30 pm, M-F.  Also accepts Medicaid and self-pay.  [facility] [place] [address], [place] Phone: [phone]   [facility] [address] Natasha Bence [place], [place] [phone], Ext. [phone] Mondays & Thursdays: 7-9 AM.  First 15 patients are seen on a first come, first serve basis.    Medicaid-accepting [facility] Providers:  Organization         Address  Phone   Notes  [facility] [address], [place] [phone] Also accepts self-pay patients.  [facility] [address] Laurell Josephs [place], [place]  [phone]   [facility] [address], [place] [phone]   [facility] [address], [place] [phone]   Renaye Rakers [address], [place]   [phone] Only accepts [place] Access [place] patients after they have their name applied to their card.   Self-Pay (no insurance) in [facility]:  Organization         Address  Phone   Notes  Sickle Cell Patients, [facility] [address] [place], [place] [phone]   [facility] Urgent Care [address] [place], [place] [phone]   [facility] [place]  [address], [place] [phone]   [facility]/Dr. Osei-Bonsu  [address] [place] or [address], [place] [phone] Phone number for both [place] and [place] locations is the same.  Urgent Medical and [facility] [address] [place] [phone]   [facility] [address] [place] or [address] [phone]   [facility] [address] [place] [phone], phone; [phone], fax Sees patients 1st and 3rd Saturday of every month.  Must not qualify for public or private insurance (i.e. Medicaid, Medicare, [place] Health Choice, Veterans' Benefits)  Household income should be no more than 200% of the poverty level The clinic cannot treat you if you are pregnant or think you are pregnant  Sexually transmitted diseases are not treated at the clinic.    Dental Care: Organization         Address  Phone  Notes  [facility] Department of [facility] [address] [place], [place] [phone] Accepts children up to age 41 who are enrolled in [place] or [place] Health Choice; pregnant women with a Medicaid card; and children who have applied for Medicaid or [place] Health Choice, but were declined, whose parents can pay a reduced fee at  time of service.  [facility] Department of [facility]  [address], [place] [phone] Accepts children up to age 74 who are enrolled in [place] or [place] Health Choice; pregnant women with a Medicaid card; and children who have applied for Medicaid or [place] Health Choice, but were declined, whose parents can pay a reduced fee at time of service.  [facility]  [address] [place], [place] [phone] Patients are seen by appointment only. Walk-ins are not accepted. [facility] will see patients 40 years of age and older. Monday - Tuesday (8am-5pm) Most Wednesdays (8:30-5pm) $30 per visit, cash only  [facility] Adult Dental Access PROGRAM  [address], [facility] [phone] Patients are seen by appointment only. Walk-ins are not accepted. [facility] will see patients 79 years of age and older. One Wednesday Evening (Monthly: Volunteer Based).  $30 per visit, cash only  [employer]  [phone] for adults; Children under age 19, call Graduate Pediatric Dentistry at [phone]. Children aged 41-14, please call [phone] to request a pediatric application.  Dental services are provided in all areas of dental care including fillings, crowns and bridges, complete and partial dentures, implants, gum treatment, root  canals, and extractions. Preventive care is also provided. Treatment is provided to both adults and children. Patients are selected via a lottery and there is often a waiting list.   [facility] [address], [place]  [phone] [email or web address]   [facility] [address] [place], [place] [phone], Ext. [phone] Second and Fourth Thursday of each month, opens at 6:30 AM; Clinic ends at 9 AM.  Patients are seen on a first-come first-served basis, and a limited number are seen during each clinic.   [facility]  [address]  [place], [place] [phone]   Eligibility Requirements You must have lived in [place], [place], or [place] counties for at least the last three months.   You cannot be eligible for state or federal sponsored [place], including [employer], [place], or [employer].   You generally cannot be eligible for healthcare insurance through your employer.    How to apply: Eligibility screenings are held every Tuesday and Wednesday afternoon from 1:00 pm until 4:00 pm. You do not need an appointment for the interview!  [facility] [address] [place], [place] [phone]   [facility] Health Department  [phone]   [facility] Health Department  [phone]   [facility] Health Department  [phone]    Behavioral Health Resources in the Community: Intensive Outpatient Programs Organization         Address  Phone  Notes  [facility] [address], [place], [place] [phone]   [facility] Outpatient [address], Urian Martenson [place], [place] [phone]   [facility] [address] [place], [place]  [phone]   [facility] Mental Health [address]  [place], [place] [phone] or [phone]   Substance Abuse Resources Organization         Address  Phone  Notes  [facility] Drug Services  [phone]   [facility]  [phone]   [place]  [phone]   Floydene Flock  [phone]   Residential & Outpatient Substance Abuse Program  [phone]   Psychological Services Organization         Address  Phone  Notes  [facility]  [phone]   [facility]  [phone]   [facility] Mental Health [address], [place] [phone] or [phone]    Mobile Crisis Teams Organization         Address  Phone  Notes  [facility], Mobile Crisis Care Unit  [phone]   [facility]  [facility]  [address] [place], [place] [phone]   Doristine Locks [address] [place] [phone]    Self-Help/Support Groups Organization         Address  Phone             Notes  [facility] [place] - variety of support groups  336- I7437963 Call for more information  Narcotics Anonymous (NA), [facility] [address], [employer] [place]  2 meetings at this location   Statistician         Address  Phone  Notes  [facility] [address],    [place]  [phone]   [facility]  [address], [place] [address], [place], [place] [phone]   [facility] [address] [place], [place] [phone] Admissions: 8am-3pm M-F  [facility]  [address]    [place], [place] [phone]   [facility] [address] [place], [place], [place] [phone]   [facility] [address]  [place], [place] [phone]   [facility] - Intensive Outpatient [address]urell Joseph[address], [place], [place] [phone]   [facility] (Addiction Recovery [facility].) [address] [place]  [place], [place] [phone] or [phone]   [facility] ([facility]) [address] [place], [place] [phone] Accepts Medicaid  [facility] [address]  [place] [phone] Substance Abuse/Addiction Treatment   [facility] Organization         Address  Phone  Notes  [facility]  [phone]   Angie Fava, PhD [address] Ervin Knack [place], [place]   [phone] or [phone]   [facility]   [address] [place], [place] [phone]   [facility] [address] [place], [place] [phone] Insurance/Medicaid/sponsorship through [facility] and Families [address]                                    [place], [place] [phone] Therapy/tele-psych/case  [facility] [address], [place] [phone]    Dr. Lolly Mustache  [phone]   [facility] [facility]  [employer] [facility] Dept. 1) [address] [place] 2) [address], [place] 3)  [address], [place] [phone]  [phone]   [facility] Child Abuse Hotline 978-033-6221 or [phone] (After Hours)

## 2015-02-15 NOTE — ED Notes (Signed)
Pt c/o left side flank pain for past month, intermittent, worse in past week.  Pt seen 1 month ago with no definitive dx per patient.  Pt states pain on left is worse when laying on her right side.  Pt took 1000mg  tylenol at approx 2pm today.

## 2015-02-15 NOTE — ED Notes (Signed)
Abdominal pain. She is [redacted] weeks pregnant. No relief with tylenol. No discharge.

## 2015-02-17 LAB — URINE CULTURE

## 2015-03-25 ENCOUNTER — Encounter: Payer: Self-pay | Admitting: Obstetrics & Gynecology

## 2015-11-14 ENCOUNTER — Encounter (HOSPITAL_COMMUNITY): Payer: Self-pay

## 2016-06-14 IMAGING — US US RENAL
1 series · 14 of 25 positions shown · non-contrast
Comparison: No priors.

CLINICAL DATA: 24-year-old female with left-sided flank pain for
the past 2-3 weeks. Remote history of urinary tract infections.

EXAM:
RENAL / URINARY TRACT ULTRASOUND COMPLETE

[Series 1: us renal · 0.20mm/px · 14 of 26 slices shown]
[im 1/26]
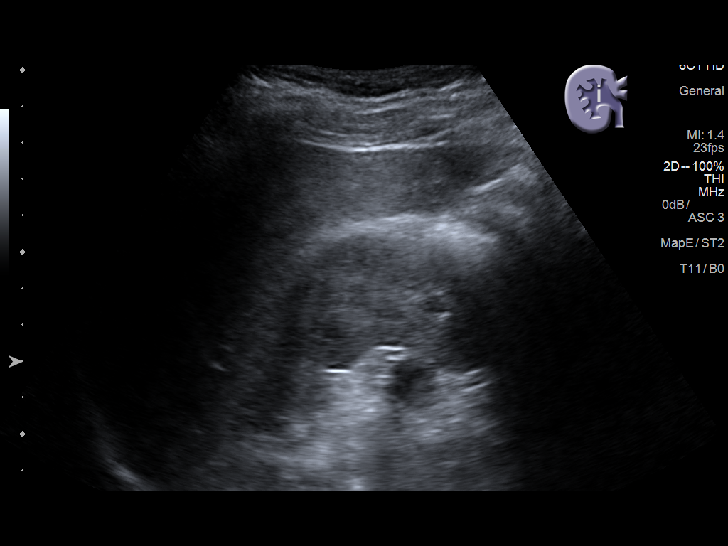
[im 3/26]
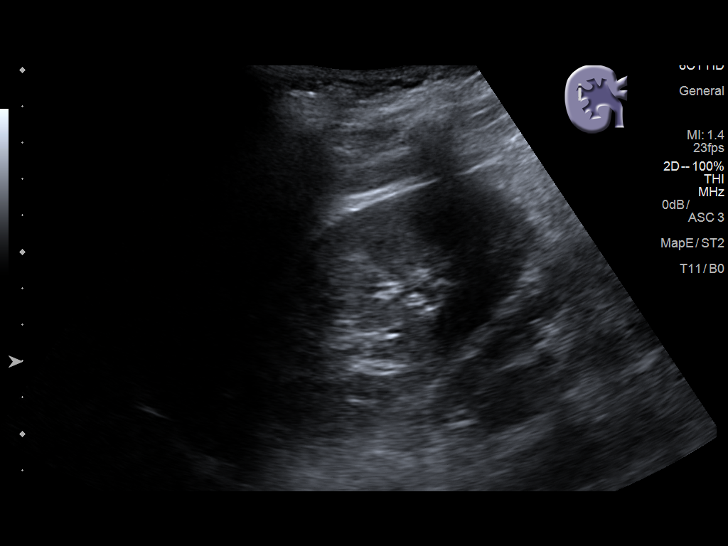
[im 5/26]
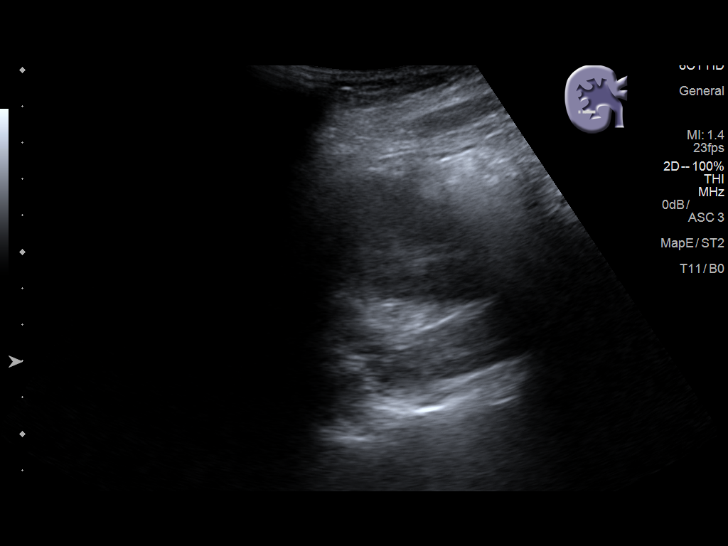
[im 7/26]
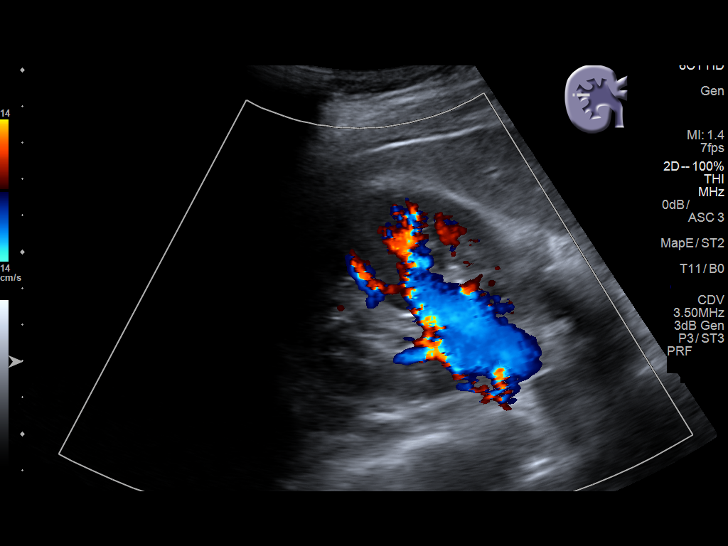
[im 9/26]
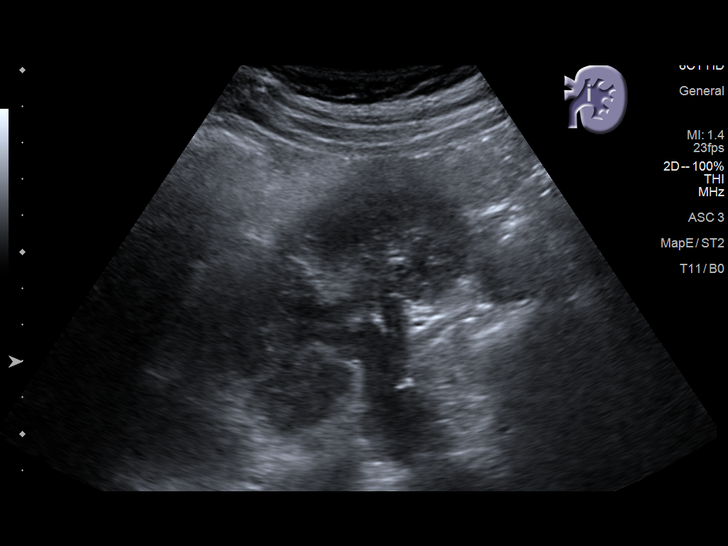
[im 10/26]
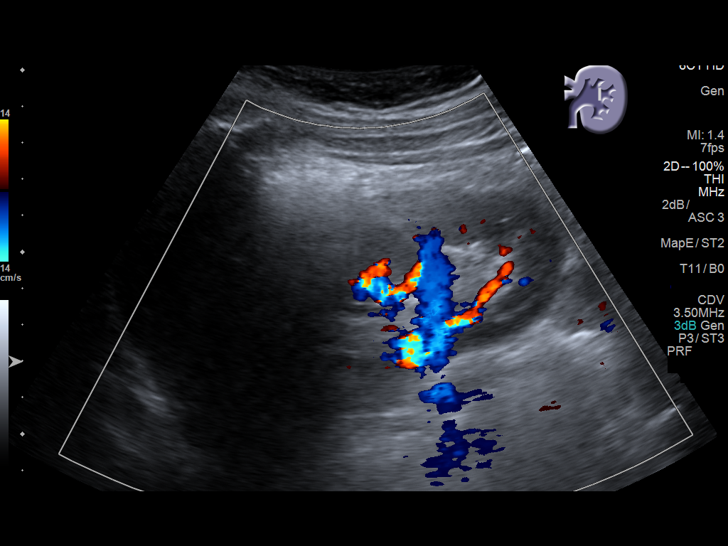
[im 12/26]
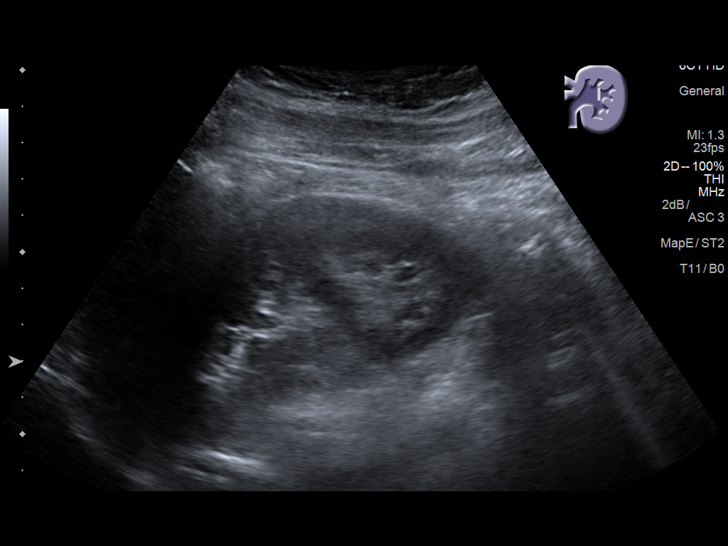
[im 14/26]
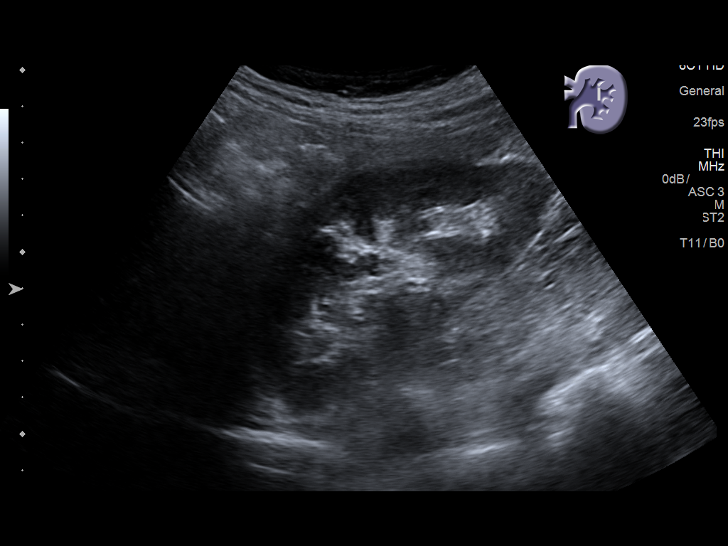
[im 16/26]
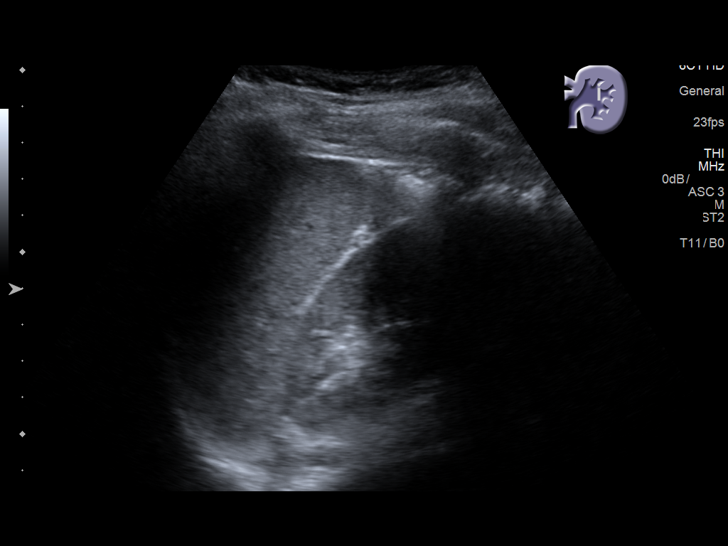
[im 17/26]
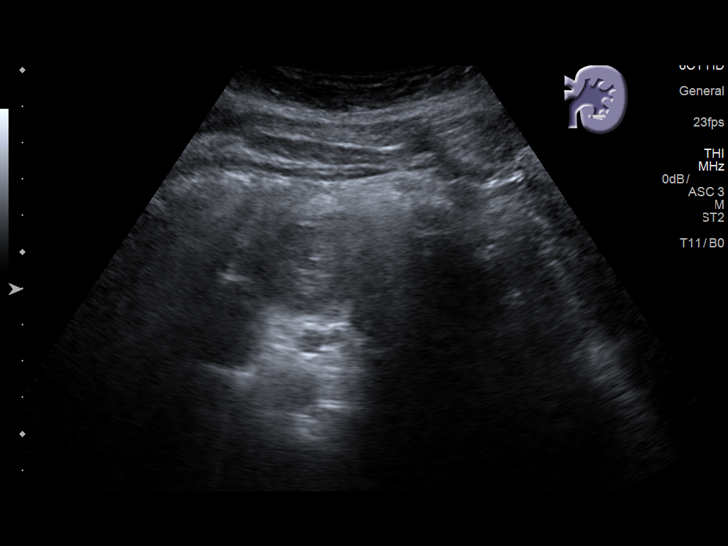
[im 19/26]
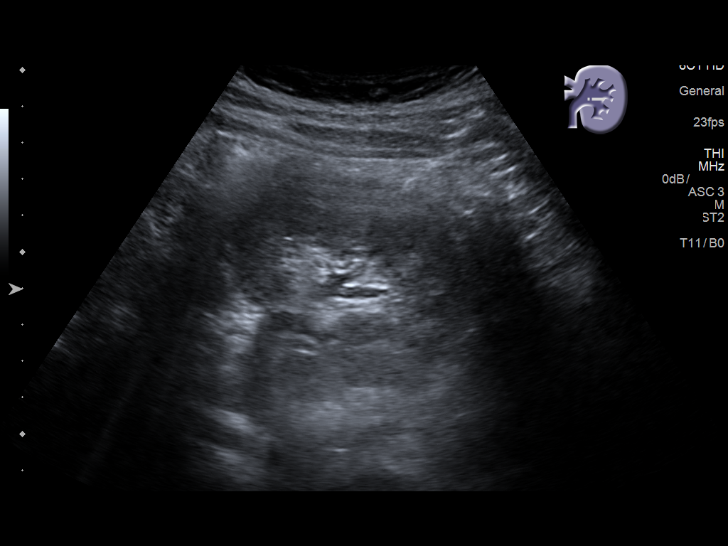
[im 21/26]
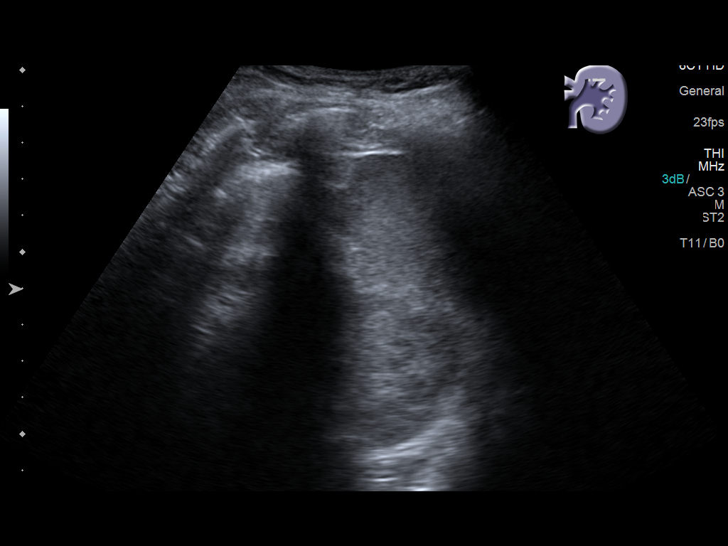
[im 23/26]
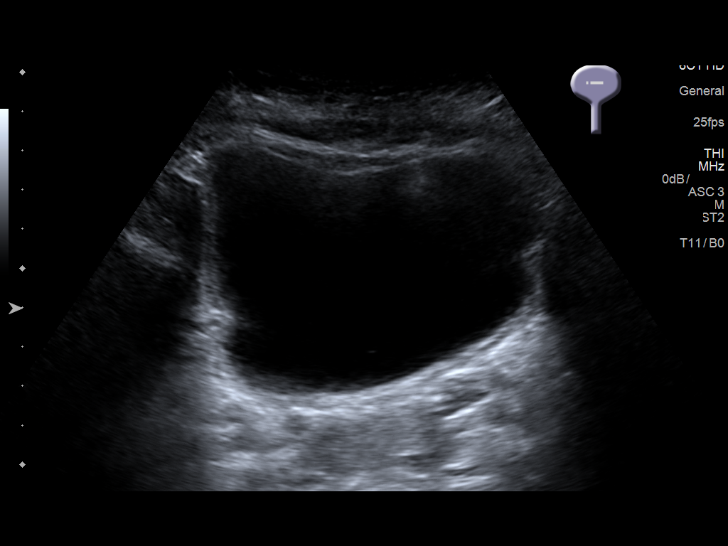
[im 26/26]
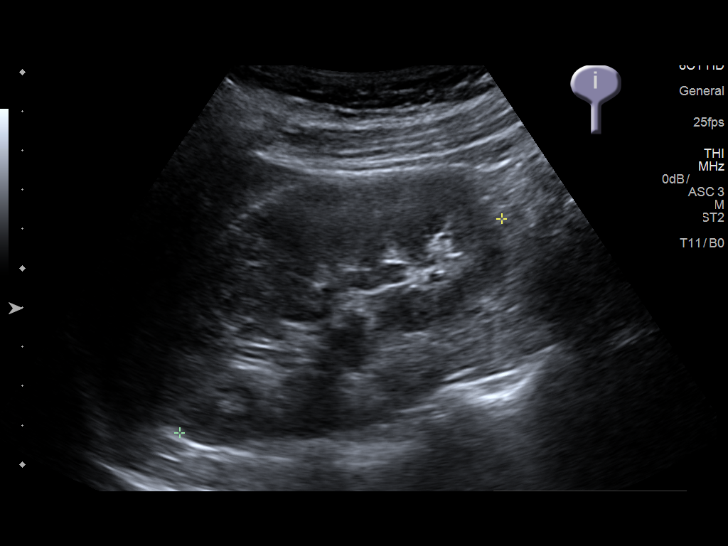

[14 of 25 positions shown; findings below may reference images not displayed]

FINDINGS: Right Kidney:

Length: 10.3 cm. Echogenicity within normal limits. No mass or
hydronephrosis visualized.

Left Kidney:

Length: 9.8 cm. Echogenicity within normal limits. No mass or
hydronephrosis visualized.

Bladder:

Appears normal for degree of bladder distention.
IMPRESSION: 1. No acute findings. Specifically, no hydronephrosis. The
appearance of the kidneys and urinary bladder is normal.

## 2016-10-03 ENCOUNTER — Emergency Department (HOSPITAL_COMMUNITY)
Admission: EM | Admit: 2016-10-03 | Discharge: 2016-10-03 | Disposition: A | Discharge status: Home / Self Care | Payer: Medicaid Other | Attending: Emergency Medicine | Admitting: Emergency Medicine

## 2016-10-03 ENCOUNTER — Encounter (HOSPITAL_COMMUNITY): Payer: Self-pay | Admitting: Emergency Medicine

## 2016-10-03 ENCOUNTER — Emergency Department (HOSPITAL_COMMUNITY): Payer: Medicaid Other

## 2016-10-03 DIAGNOSIS — Z87891 Personal history of nicotine dependence: Secondary | ICD-10-CM | POA: Insufficient documentation

## 2016-10-03 DIAGNOSIS — N730 Acute parametritis and pelvic cellulitis: Secondary | ICD-10-CM

## 2016-10-03 DIAGNOSIS — Z79899 Other long term (current) drug therapy: Secondary | ICD-10-CM | POA: Diagnosis not present

## 2016-10-03 DIAGNOSIS — R1030 Lower abdominal pain, unspecified: Secondary | ICD-10-CM | POA: Diagnosis present

## 2016-10-03 LAB — WET PREP, GENITAL
Clue Cells Wet Prep HPF POC: NONE SEEN
Sperm: NONE SEEN
TRICH WET PREP: NONE SEEN
YEAST WET PREP: NONE SEEN

## 2016-10-03 LAB — CBC
HEMATOCRIT: 35.2 % — AB (ref 36.0–46.0)
Hemoglobin: 11.7 g/dL — ABNORMAL LOW (ref 12.0–15.0)
MCH: 29.5 pg (ref 26.0–34.0)
MCHC: 33.2 g/dL (ref 30.0–36.0)
MCV: 88.7 fL (ref 78.0–100.0)
Platelets: 249 10*3/uL (ref 150–400)
RBC: 3.97 MIL/uL (ref 3.87–5.11)
RDW: 14.3 % (ref 11.5–15.5)
WBC: 10 10*3/uL (ref 4.0–10.5)

## 2016-10-03 LAB — COMPREHENSIVE METABOLIC PANEL
ALBUMIN: 3.6 g/dL (ref 3.5–5.0)
ALK PHOS: 42 U/L (ref 38–126)
ALT: 21 U/L (ref 14–54)
AST: 16 U/L (ref 15–41)
Anion gap: 9 (ref 5–15)
BILIRUBIN TOTAL: 1.3 mg/dL — AB (ref 0.3–1.2)
BUN: 7 mg/dL (ref 6–20)
CALCIUM: 8.9 mg/dL (ref 8.9–10.3)
CO2: 23 mmol/L (ref 22–32)
Chloride: 108 mmol/L (ref 101–111)
Creatinine, Ser: 0.78 mg/dL (ref 0.44–1.00)
GFR calc Af Amer: >60 mL/min (ref >60)
GFR calc non Af Amer: >60 mL/min (ref >60)
GLUCOSE: 86 mg/dL (ref 65–99)
Potassium: 3.6 mmol/L (ref 3.5–5.1)
Sodium: 140 mmol/L (ref 135–145)
Total Protein: 6.4 g/dL — ABNORMAL LOW (ref 6.5–8.1)

## 2016-10-03 LAB — URINALYSIS, ROUTINE W REFLEX MICROSCOPIC
Bilirubin Urine: NEGATIVE
Glucose, UA: NEGATIVE mg/dL
Ketones, ur: NEGATIVE mg/dL
Nitrite: NEGATIVE
PROTEIN: NEGATIVE mg/dL
SPECIFIC GRAVITY, URINE: 1.023 (ref 1.005–1.030)
pH: 7 (ref 5.0–8.0)

## 2016-10-03 LAB — LIPASE, BLOOD: Lipase: 27 U/L (ref 11–51)

## 2016-10-03 LAB — I-STAT BETA HCG BLOOD, ED (MC, WL, AP ONLY)

## 2016-10-03 MED ORDER — LIDOCAINE HCL (PF) 1 % IJ SOLN
INTRAMUSCULAR | Status: AC
  Administered 2016-10-03: 5 mL
  Filled 2016-10-03: qty 5

## 2016-10-03 MED ORDER — DOXYCYCLINE HYCLATE 100 MG PO TABS
100.0000 mg | ORAL_TABLET | Freq: Two times a day (BID) | ORAL | Status: DC
  Administered 2016-10-03: 100 mg via ORAL
  Filled 2016-10-03: qty 1

## 2016-10-03 MED ORDER — DOXYCYCLINE HYCLATE 100 MG PO CAPS: 100.0000 mg | ORAL_CAPSULE | Freq: Two times a day (BID) | ORAL | 0 refills | Status: AC

## 2016-10-03 MED ORDER — IOPAMIDOL (ISOVUE-300) INJECTION 61%
INTRAVENOUS | Status: AC
  Administered 2016-10-03: 100 mL
  Filled 2016-10-03: qty 100

## 2016-10-03 MED ORDER — CEFTRIAXONE SODIUM 250 MG IJ SOLR
250.0000 mg | Freq: Once | INTRAMUSCULAR | Status: AC
  Administered 2016-10-03: 250 mg via INTRAMUSCULAR
  Filled 2016-10-03: qty 250

## 2016-10-03 MED ORDER — NAPROXEN 500 MG PO TABS: 500.0000 mg | ORAL_TABLET | Freq: Two times a day (BID) | ORAL | 0 refills | Status: AC

## 2016-10-03 MED ORDER — KETOROLAC TROMETHAMINE 30 MG/ML IJ SOLN
30.0000 mg | Freq: Once | INTRAMUSCULAR | Status: AC
  Administered 2016-10-03: 30 mg via INTRAVENOUS
  Filled 2016-10-03: qty 1

## 2016-10-03 MED ORDER — ONDANSETRON HCL 4 MG/2ML IJ SOLN
4.0000 mg | Freq: Once | INTRAMUSCULAR | Status: AC
  Administered 2016-10-03: 4 mg via INTRAVENOUS
  Filled 2016-10-03: qty 2

## 2016-10-03 MED ORDER — OXYCODONE-ACETAMINOPHEN 5-325 MG PO TABS
ORAL_TABLET | ORAL | Status: AC
  Filled 2016-10-03: qty 1

## 2016-10-03 MED ORDER — SODIUM CHLORIDE 0.9 % IV BOLUS (SEPSIS)
1000.0000 mL | Freq: Once | INTRAVENOUS | Status: AC
  Administered 2016-10-03: 1000 mL via INTRAVENOUS

## 2016-10-03 MED ORDER — TRAMADOL HCL 50 MG PO TABS: 50.0000 mg | ORAL_TABLET | Freq: Four times a day (QID) | ORAL | 0 refills | Status: AC | PRN

## 2016-10-03 MED ORDER — MORPHINE SULFATE (PF) 4 MG/ML IV SOLN
4.0000 mg | Freq: Once | INTRAVENOUS | Status: AC
Start: 2016-10-03 — End: 2016-10-03
  Administered 2016-10-03: 4 mg via INTRAVENOUS
  Filled 2016-10-03: qty 1

## 2016-10-03 MED ORDER — OXYCODONE-ACETAMINOPHEN 5-325 MG PO TABS
1.0000 | ORAL_TABLET | ORAL | Status: DC | PRN
  Administered 2016-10-03: 1 via ORAL

## 2016-10-03 NOTE — ED Triage Notes (Signed)
Pt to ER for RLQ abd pain onset yesterday with progressive worsening and nausea onset today. States pain is worse with standing, having a bowel movement, and urinating states there is so much pressure. Last bowel movement yesterday. Recent pregnancy 3 months ago.

## 2016-10-03 NOTE — ED Notes (Signed)
Pt returned from CT °

## 2016-10-03 NOTE — ED Triage Notes (Signed)
Pt appears very uncomfortable, unable to sit still in wheelchair.

## 2016-10-03 NOTE — ED Provider Notes (Signed)
[facility]-EMERGENCY DEPT Provider Note   CSN: [id number] Arrival date & time: 10/03/16  1440     History   Chief Complaint Chief Complaint  Patient presents with  . Abdominal Pain    HPI Laura Santos is a 26 y.o. female.  HPI Laura Santos is a 26 y.o. female  Presents to emergency department complaining of abdominal pain. Patient states she developed lower abdominal pain yesterday and she states today the pain is much worse. Pain is constant, radiates all over her abdomen and bilateral flank. Pain is worse with any movement and standing up. Better with holding still. Received a Percocet in the triage and it made it feel slightly better. Reports associated nausea, no vomiting. Reports that her bowel movements are very painful. Denies diarrhea. Denies any blood in her stool. Denies any vaginal discharge but states she is currently on her menstrual period. She reports delivering vaginally term child 3 months ago. No complications at time of birth.  Past Medical History:  Diagnosis Date  . Abnormal Pap smear   . History of chlamydia   . History of gonorrhea   . HPV-genital warts   . Vaginal delivery 2012 2015    Patient Active Problem List   Diagnosis Date Noted  . Genital warts 08/10/2014    Past Surgical History:  Procedure Laterality Date  . CONDYLOMA EXCISION/FULGURATION N/A 10/06/2014   Procedure: CONDYLOMA REMOVAL;  Surgeon: Willodean Rosenthalarolyn Harraway-Smith, MD;  Location: [facility];  Service: Gynecology;  Laterality: N/A;  and condyloma removal in anal area  . WISDOM TOOTH EXTRACTION      OB History    Gravida Para Term Preterm AB Living   3 2 2  0 0 2   SAB TAB Ectopic Multiple Live Births   0 0 0 0 2       Home Medications    Prior to Admission medications   Medication Sig Start Date End Date Taking? Authorizing Provider  Prenatal Vit-Fe Fumarate-FA (PRENATAL VITAMINS) 28-0.8 MG TABS Take 1 tablet by mouth daily. 02/15/15   Trixie DredgeWest, Emily, PA-C    Family History Family  History  Problem Relation Age of Onset  . Hypertension Mother   . Diabetes Maternal Aunt   . Hypertension Maternal Aunt   . Diabetes Maternal Grandmother   . Hypertension Maternal Grandmother   . Diabetes Cousin     Social History Social History  Substance Use Topics  . Smoking status: Former Smoker    Packs/day: 0.50    Years: 4.00    Types: Cigarettes    Quit date: 08/16/2010  . Smokeless tobacco: Never Used  . Alcohol use No     Comment: Occasional     Allergies   Patient has no known allergies.   Review of Systems Review of Systems  Constitutional: Negative for chills and fever.  Respiratory: Negative for cough, chest tightness and shortness of breath.   Cardiovascular: Negative for chest pain, palpitations and leg swelling.  Gastrointestinal: Positive for abdominal pain and nausea. Negative for constipation, diarrhea and vomiting.  Genitourinary: Positive for pelvic pain and vaginal bleeding. Negative for dysuria, flank pain, vaginal discharge and vaginal pain.  Musculoskeletal: Negative for arthralgias, myalgias, neck pain and neck stiffness.  Skin: Negative for rash.  Neurological: Negative for dizziness, weakness and headaches.  All other systems reviewed and are negative.    Physical Exam Updated Vital Signs BP 107/63   Pulse 83   Temp 99.2 F (37.3 C) (Oral)   Resp 20  LMP 10/03/2016 (Exact Date)   SpO2 100%   Breastfeeding? No   Physical Exam  Constitutional: She is oriented to person, place, and time. She appears well-developed and well-nourished. No distress.  HENT:  Head: Normocephalic.  Eyes: Conjunctivae are normal.  Neck: Neck supple.  Cardiovascular: Normal rate, regular rhythm and normal heart sounds.   Pulmonary/Chest: Effort normal and breath sounds normal. No respiratory distress. She has no wheezes. She has no rales.  Abdominal: Soft. Bowel sounds are normal. She exhibits no distension. There is tenderness. There is no rebound.    Diffuse lower abdominal tenderness. No guarding.  Genitourinary:  Genitourinary Comments: Normal external genitalia. Pink vaginal discharge. Cervix erythematous. Positive cervical motion tenderness, tenderness of the uterus and bilateral adnexa.  Musculoskeletal: She exhibits no edema.  Neurological: She is alert and oriented to person, place, and time.  Skin: Skin is warm and dry.  Psychiatric: She has a normal mood and affect. Her behavior is normal.  Nursing note and vitals reviewed.    ED Treatments / Results  Labs (all labs ordered are listed, but only abnormal results are displayed) Labs Reviewed  WET PREP, GENITAL - Abnormal; Notable for the following:       Result Value   WBC, Wet Prep HPF POC MANY (*)    All other components within normal limits  COMPREHENSIVE METABOLIC PANEL - Abnormal; Notable for the following:    Total Protein 6.4 (*)    Total Bilirubin 1.3 (*)    All other components within normal limits  CBC - Abnormal; Notable for the following:    Hemoglobin 11.7 (*)    HCT 35.2 (*)    All other components within normal limits  URINALYSIS, ROUTINE W REFLEX MICROSCOPIC - Abnormal; Notable for the following:    APPearance HAZY (*)    Hgb urine dipstick LARGE (*)    Leukocytes, UA MODERATE (*)    Bacteria, UA RARE (*)    Squamous Epithelial / LPF 0-5 (*)    All other components within normal limits  URINE CULTURE  LIPASE, BLOOD  POC URINE PREG, ED  I-STAT BETA HCG BLOOD, ED ([facility], [facility], [facility] ONLY)  GC/CHLAMYDIA PROBE AMP ([place]) NOT AT [facility]    EKG  EKG Interpretation None       Radiology Ct Abdomen Pelvis W Contrast  Result Date: 10/03/2016 CLINICAL DATA:  Lower abdominal pain with nausea and diarrhea EXAM: CT ABDOMEN AND PELVIS WITH CONTRAST TECHNIQUE: Multidetector CT imaging of the abdomen and pelvis was performed using the standard protocol following bolus administration of intravenous contrast. CONTRAST:  ISOVUE-300 IOPAMIDOL (ISOVUE-300)  INJECTION 61% COMPARISON:  10/29/2009 FINDINGS: Lower chest: Lung bases demonstrate no acute consolidation or pleural effusion. Normal heart size. Hepatobiliary: No focal liver abnormality is seen. No gallstones, gallbladder wall thickening, or biliary dilatation. Pancreas: Unremarkable. No pancreatic ductal dilatation or surrounding inflammatory changes. Spleen: Normal in size without focal abnormality. Adrenals/Urinary Tract: Adrenal glands are unremarkable. Kidneys are normal, without renal calculi, focal lesion, or hydronephrosis. Bladder is unremarkable. Stomach/Bowel: Stomach is within normal limits. Appendix appears normal. No evidence of bowel wall thickening, distention, or inflammatory changes. Fluid-filled distal small bowel and colon. Vascular/Lymphatic: No significant vascular findings are present. No enlarged abdominal or pelvic lymph nodes. Reproductive: Uterus and bilateral adnexa are unremarkable. Other: Small free fluid in the pelvis. Negative for free air. Small fat at the umbilicus Musculoskeletal: No acute or significant osseous findings. IMPRESSION: 1. No CT evidence for acute intra-abdominal or pelvic pathology.  Small amount of free fluid in the pelvis 2. Fluid-filled distal small bowel and colon, nonspecific but could be secondary to gastroenteritis. Electronically Signed   By: Jasmine Pang M.D.   On: 10/03/2016 17:07    Procedures Procedures (including critical care time)  Medications Ordered in ED Medications  oxyCODONE-acetaminophen (PERCOCET/ROXICET) 5-325 MG per tablet 1 tablet (1 tablet Oral Given 10/03/16 1455)  doxycycline (VIBRA-TABS) tablet 100 mg (not administered)  morphine 4 MG/ML injection 4 mg (4 mg Intravenous Given 10/03/16 1641)  ondansetron (ZOFRAN) injection 4 mg (4 mg Intravenous Given 10/03/16 1641)  sodium chloride 0.9 % bolus 1,000 mL (0 mLs Intravenous Stopped 10/03/16 1825)  iopamidol (ISOVUE-300) 61 % injection (100 mLs  Contrast Given 10/03/16 1652)  ketorolac  (TORADOL) 30 MG/ML injection 30 mg (30 mg Intravenous Given 10/03/16 1822)  cefTRIAXone (ROCEPHIN) injection 250 mg (250 mg Intramuscular Given 10/03/16 1825)  lidocaine (PF) (XYLOCAINE) 1 % injection (5 mLs  Given 10/03/16 1825)     Initial Impression / Assessment and Plan / ED Course  I have reviewed the triage vital signs and the nursing notes.  Pertinent labs & imaging results that were available during my care of the patient were reviewed by me and considered in my medical decision making (see chart for details).    Patient with severe lower abdominal pain, onset yesterday. States the worst today. Worse with any movement and palpation of the abdomen.Labs obtained in triage, unremarkable.She had a normal vaginal delivery 3 months ago. She denies any vaginal complaints but states she did start her period about 4 days ago. Will perform pelvic exam. Will get CT abdomen and pelvis. Fluids and pain medications ordered. Her vital signs are normal at this time.  6:06 PM Wet prep showing many WBCs. Urinalysis contaminated, showing rare bacteria, 6-30 white blood cells. I will send culture. Given exam findings of very tender cervix, uterus, with many white blood cells on wet prep, concerning her cervicitis and PID. She is afebrile here, normal white blood cell count, she is not pregnant. I will treat her with Rocephin 250 mg IM and department. We will give prescription for doxycycline. Will provide with tramadol and naproxen for pain. Instructed to follow-up with family doctor or OB/GYN or return if she is getting worse. Patient agreed to the plan.   Final Clinical Impressions(s) / ED Diagnoses   Final diagnoses:  PID (acute pelvic inflammatory disease)    New Prescriptions New Prescriptions   DOXYCYCLINE (VIBRAMYCIN) 100 MG CAPSULE    Take 1 capsule (100 mg total) by mouth 2 (two) times daily.   NAPROXEN (NAPROSYN) 500 MG TABLET    Take 1 tablet (500 mg total) by mouth 2 (two) times daily.    TRAMADOL (ULTRAM) 50 MG TABLET    Take 1 tablet (50 mg total) by mouth every 6 (six) hours as needed.     Jaynie Crumble, PA-C 10/03/16 1830    Little, Ambrose Finland, MD 10/04/16 [phone]

## 2016-10-03 NOTE — ED Notes (Signed)
Patient transported to CT 

## 2016-10-03 NOTE — Discharge Instructions (Signed)
Take doxycycline as prescribed until all gone. Take naprosyn for pain. Take tramadol for severe pain. Follow up with family doctor or OB/GYN. Return if worsening.

## 2016-10-05 LAB — URINE CULTURE

## 2016-10-05 LAB — GC/CHLAMYDIA PROBE AMP (~~LOC~~) NOT AT ARMC
Chlamydia: NEGATIVE
Neisseria Gonorrhea: POSITIVE — AB

## 2017-12-14 IMAGING — CT CT ABD-PELV W/ CM
2 of 4 series · 16 of 46 positions shown, 18 images · IV contrast (Omni 300)
Comparison: 10/29/2009

CLINICAL DATA: Lower abdominal pain with nausea and diarrhea

EXAM:
CT ABDOMEN AND PELVIS WITH CONTRAST
TECHNIQUE: Multidetector CT imaging of the abdomen and pelvis was performed
using the standard protocol following bolus administration of
intravenous contrast.
CONTRAST:  100mL [id number] IOPAMIDOL ([id number]) INJECTION 61%

[Series 3: a/p w/ 5mm · axial · 0.78mm/px · z∈[+708,+1148]mm · 13 of 96 slices shown, 15 images]
[im 4/96  soft-tissue]
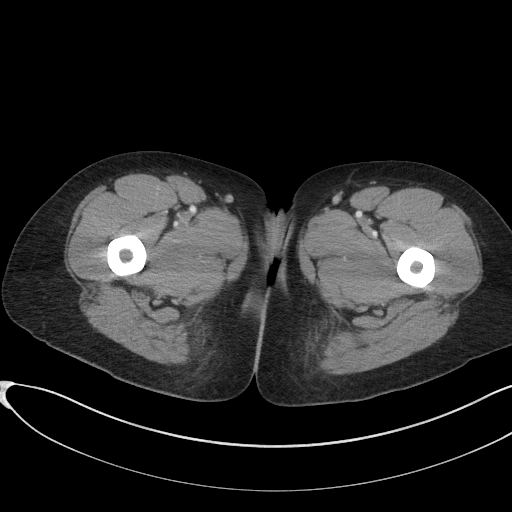
[im 4/96  bone]
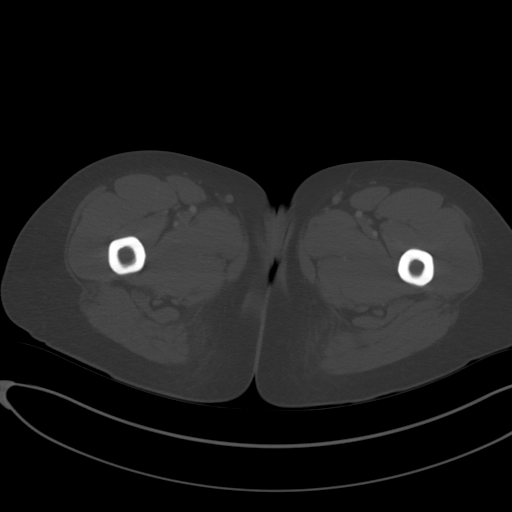
[im 12/96  soft-tissue]
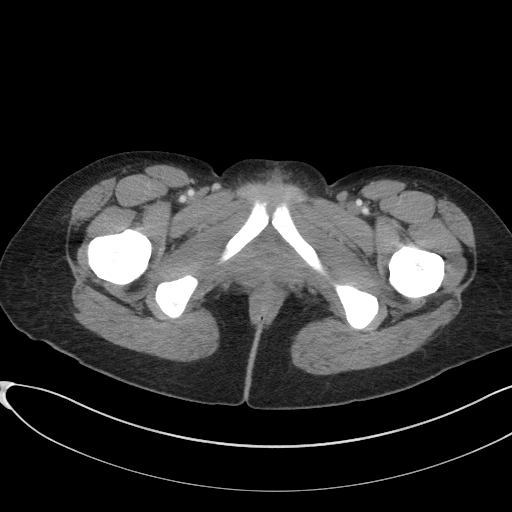
[im 20/96  soft-tissue]
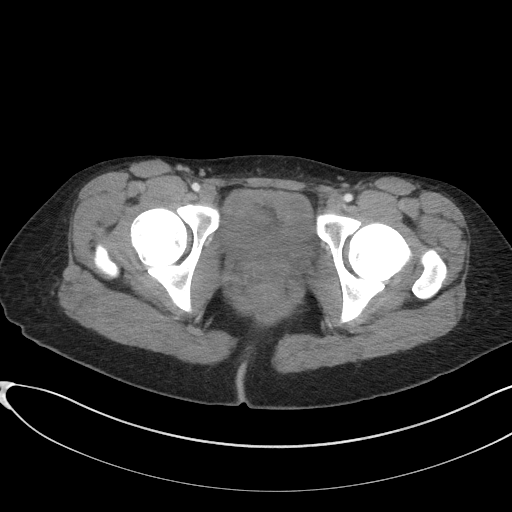
[im 27/96  soft-tissue]
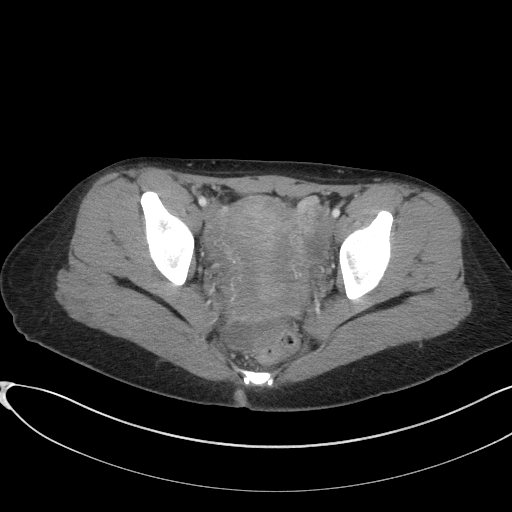
[im 35/96  soft-tissue]
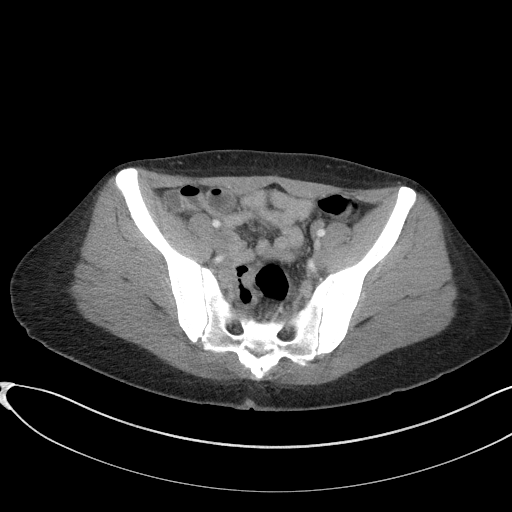
[im 42/96  soft-tissue]
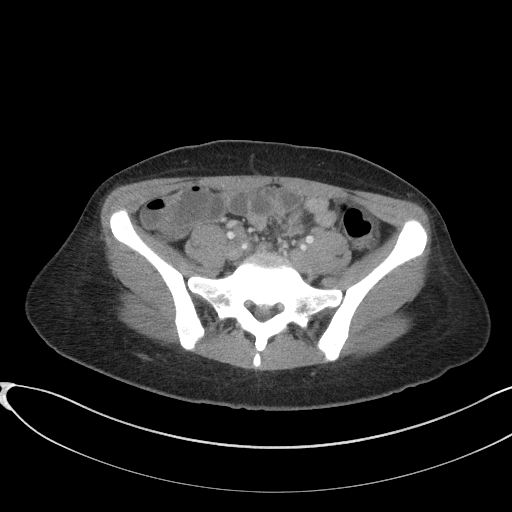
[im 50/96  soft-tissue]
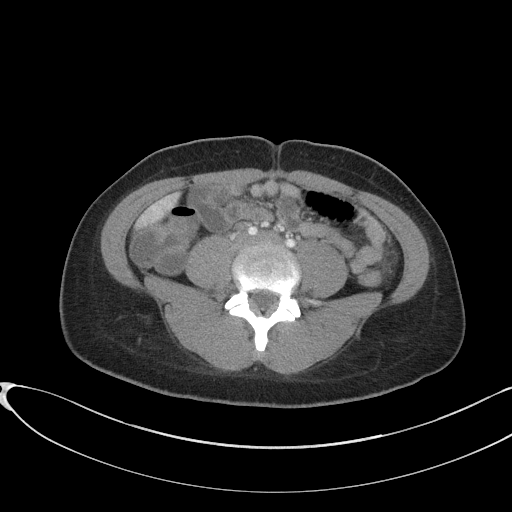
[im 54/96  soft-tissue]
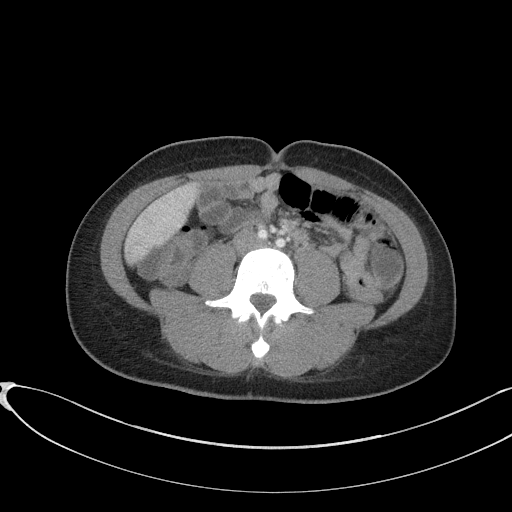
[im 61/96  soft-tissue]
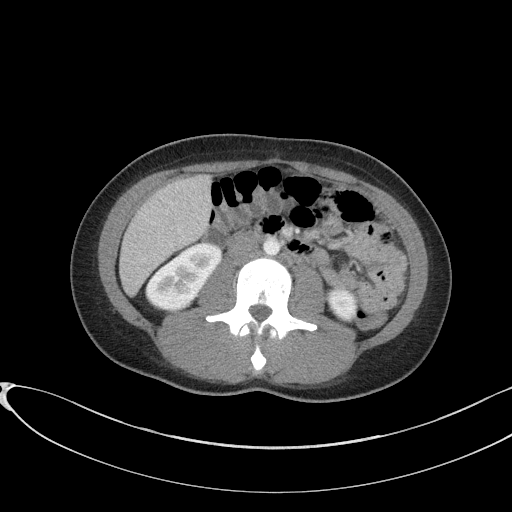
[im 61/96  bone]
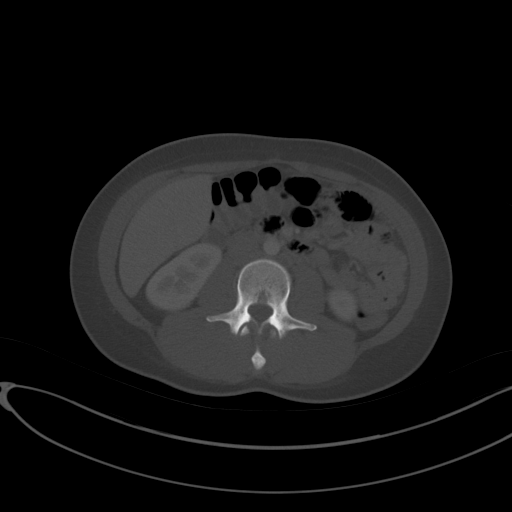
[im 69/96  soft-tissue]
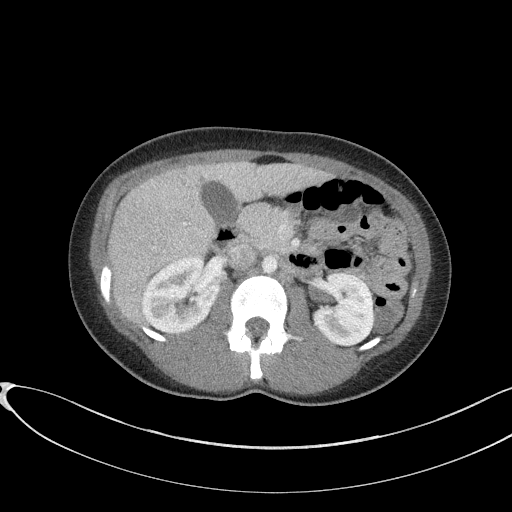
[im 77/96  soft-tissue]
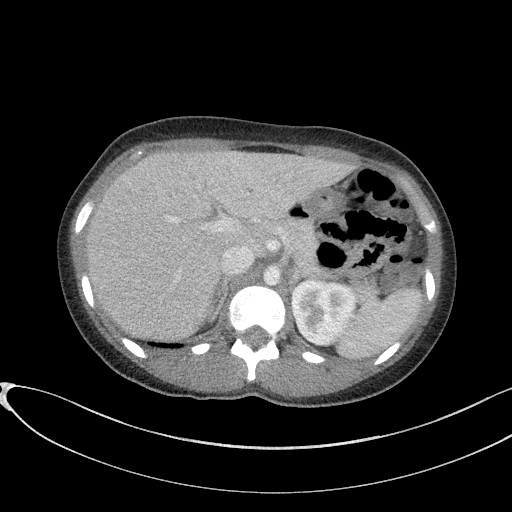
[im 84/96  soft-tissue]
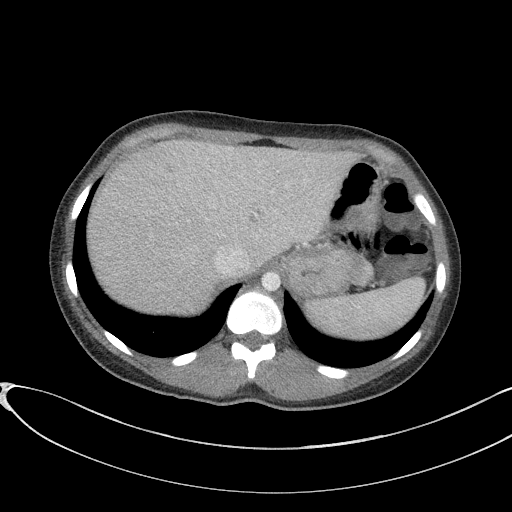
[im 92/96  soft-tissue]
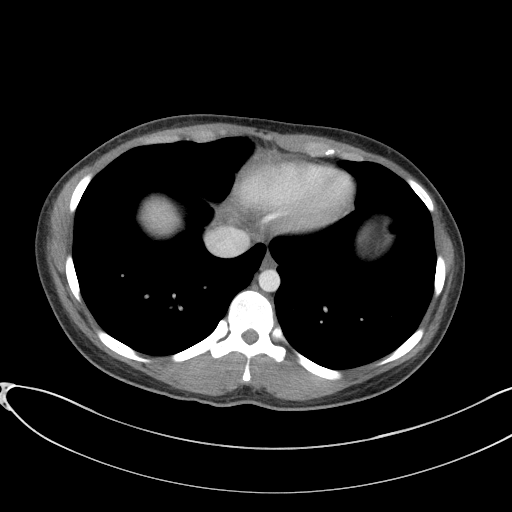

[Series 6: a/p w/ cor · coronal · 0.82mm/px · 3 of 118 slices shown]
[im 40/118  soft-tissue]
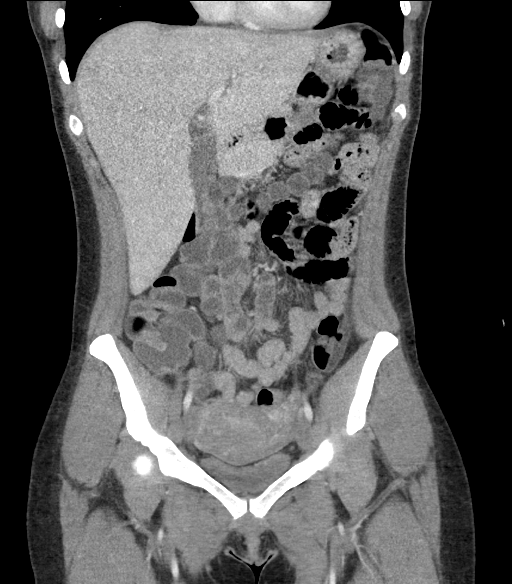
[im 53/118  soft-tissue]
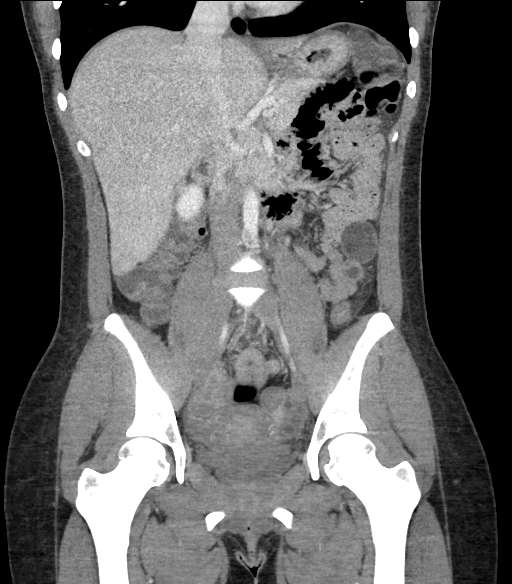
[im 66/118  soft-tissue]
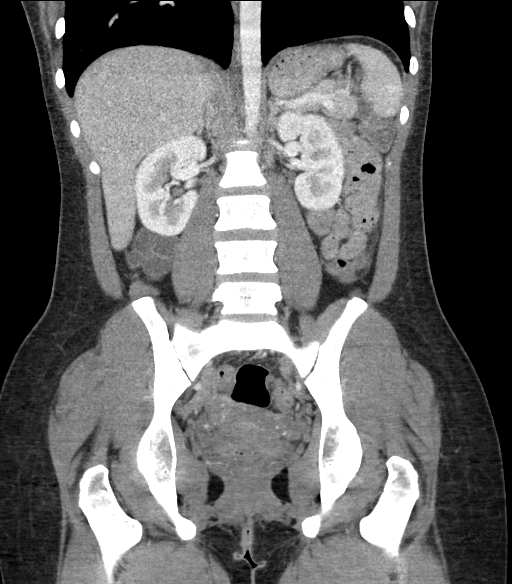

[16 of 46 positions shown; findings below may reference images not displayed]

FINDINGS: Lower chest: Lung bases demonstrate no acute consolidation or
pleural effusion. Normal heart size.

Hepatobiliary: No focal liver abnormality is seen. No gallstones,
gallbladder wall thickening, or biliary dilatation.

Pancreas: Unremarkable. No pancreatic ductal dilatation or
surrounding inflammatory changes.

Spleen: Normal in size without focal abnormality.

Adrenals/Urinary Tract: Adrenal glands are unremarkable. Kidneys are
normal, without renal calculi, focal lesion, or hydronephrosis.
Bladder is unremarkable.

Stomach/Bowel: Stomach is within normal limits. Appendix appears
normal. No evidence of bowel wall thickening, distention, or
inflammatory changes. Fluid-filled distal small bowel and colon.

Vascular/Lymphatic: No significant vascular findings are present. No
enlarged abdominal or pelvic lymph nodes.

Reproductive: Uterus and bilateral adnexa are unremarkable.

Other: Small free fluid in the pelvis. Negative for free air. Small
fat at the umbilicus

Musculoskeletal: No acute or significant osseous findings.
IMPRESSION: 1. No CT evidence for acute intra-abdominal or pelvic pathology.
Small amount of free fluid in the pelvis
2. Fluid-filled distal small bowel and colon, nonspecific but could
be secondary to gastroenteritis.
